# Patient Record
Sex: Male | Born: 1975 | Race: Black or African American | Hispanic: No | Marital: Married | Smoking: Former smoker
Health system: Southern US, Community
[De-identification: ages and names within clinical notes are randomized; demographics above are authoritative.]

## PROBLEM LIST (undated history)

## (undated) DIAGNOSIS — N471 Phimosis: Secondary | ICD-10-CM

---

## 2013-02-27 ENCOUNTER — Other Ambulatory Visit: Payer: Self-pay | Admitting: Urology

## 2013-03-01 ENCOUNTER — Encounter (HOSPITAL_BASED_OUTPATIENT_CLINIC_OR_DEPARTMENT_OTHER): Payer: Self-pay | Admitting: *Deleted
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
## 2013-03-02 ENCOUNTER — Encounter (HOSPITAL_BASED_OUTPATIENT_CLINIC_OR_DEPARTMENT_OTHER): Payer: Self-pay | Admitting: Anesthesiology

## 2013-03-02 ENCOUNTER — Ambulatory Visit (HOSPITAL_BASED_OUTPATIENT_CLINIC_OR_DEPARTMENT_OTHER)
Admission: RE | Admit: 2013-03-02 | Discharge: 2013-03-02 | Disposition: A | Discharge status: Home / Self Care | Payer: BC Managed Care – PPO | Source: Ambulatory Visit | Attending: Urology | Admitting: Urology

## 2013-03-02 ENCOUNTER — Ambulatory Visit (HOSPITAL_BASED_OUTPATIENT_CLINIC_OR_DEPARTMENT_OTHER): Payer: BC Managed Care – PPO | Admitting: Anesthesiology

## 2013-03-02 ENCOUNTER — Encounter (HOSPITAL_BASED_OUTPATIENT_CLINIC_OR_DEPARTMENT_OTHER)
Admission: RE | Disposition: A | Discharge status: Home / Self Care | Payer: Self-pay | Source: Ambulatory Visit | Attending: Urology

## 2013-03-02 DIAGNOSIS — E7800 Pure hypercholesterolemia, unspecified: Secondary | ICD-10-CM | POA: Insufficient documentation

## 2013-03-02 DIAGNOSIS — N471 Phimosis: Secondary | ICD-10-CM | POA: Insufficient documentation

## 2013-03-02 DIAGNOSIS — N476 Balanoposthitis: Secondary | ICD-10-CM | POA: Insufficient documentation

## 2013-03-02 DIAGNOSIS — N469 Male infertility, unspecified: Secondary | ICD-10-CM | POA: Insufficient documentation

## 2013-03-02 DIAGNOSIS — N478 Other disorders of prepuce: Secondary | ICD-10-CM | POA: Insufficient documentation

## 2013-03-02 DIAGNOSIS — Z87891 Personal history of nicotine dependence: Secondary | ICD-10-CM | POA: Insufficient documentation

## 2013-03-02 HISTORY — PX: CIRCUMCISION: SHX1350

## 2013-03-02 HISTORY — DX: Phimosis: N47.1

## 2013-03-02 SURGERY — CIRCUMCISION, ADULT
Anesthesia: General | Site: Penis | Wound class: Clean

## 2013-03-02 MED ORDER — LIDOCAINE HCL (CARDIAC) 20 MG/ML IV SOLN
INTRAVENOUS | Status: DC | PRN
  Administered 2013-03-02: 80 mg via INTRAVENOUS

## 2013-03-02 MED ORDER — LACTATED RINGERS IV SOLN
INTRAVENOUS | Status: DC
  Filled 2013-03-02: qty 1000

## 2013-03-02 MED ORDER — ONDANSETRON HCL 4 MG/2ML IJ SOLN
INTRAMUSCULAR | Status: DC | PRN
  Administered 2013-03-02: 4 mg via INTRAVENOUS

## 2013-03-02 MED ORDER — HYDROCODONE-ACETAMINOPHEN 5-325 MG PO TABS
1.0000 | ORAL_TABLET | ORAL | Status: DC | PRN
  Administered 2013-03-02: 1 via ORAL
  Filled 2013-03-02: qty 1

## 2013-03-02 MED ORDER — DEXAMETHASONE SODIUM PHOSPHATE 4 MG/ML IJ SOLN
INTRAMUSCULAR | Status: DC | PRN
  Administered 2013-03-02: 10 mg via INTRAVENOUS

## 2013-03-02 MED ORDER — MIDAZOLAM HCL 5 MG/5ML IJ SOLN
INTRAMUSCULAR | Status: DC | PRN
  Administered 2013-03-02: 2 mg via INTRAVENOUS

## 2013-03-02 MED ORDER — LACTATED RINGERS IV SOLN
INTRAVENOUS | Status: DC
  Administered 2013-03-02: 08:00:00 via INTRAVENOUS
  Filled 2013-03-02: qty 1000

## 2013-03-02 MED ORDER — FENTANYL CITRATE 0.05 MG/ML IJ SOLN
25.0000 ug | INTRAMUSCULAR | Status: DC | PRN
  Filled 2013-03-02: qty 1

## 2013-03-02 MED ORDER — BUPIVACAINE HCL (PF) 0.25 % IJ SOLN
INTRAMUSCULAR | Status: DC | PRN
  Administered 2013-03-02: 10 mL

## 2013-03-02 MED ORDER — FENTANYL CITRATE 0.05 MG/ML IJ SOLN
INTRAMUSCULAR | Status: DC | PRN
  Administered 2013-03-02 (×2): 50 ug via INTRAVENOUS

## 2013-03-02 MED ORDER — BACITRACIN-NEOMYCIN-POLYMYXIN 400-5-5000 EX OINT
TOPICAL_OINTMENT | CUTANEOUS | Status: DC | PRN
  Administered 2013-03-02: 1 via TOPICAL

## 2013-03-02 MED ORDER — HYDROCODONE-ACETAMINOPHEN 5-325 MG PO TABS: 1.0000 | ORAL_TABLET | Freq: Four times a day (QID) | ORAL | Status: DC | PRN

## 2013-03-02 MED ORDER — PROPOFOL 10 MG/ML IV BOLUS
INTRAVENOUS | Status: DC | PRN
  Administered 2013-03-02: 250 mg via INTRAVENOUS

## 2013-03-02 SURGICAL SUPPLY — 25 items
BANDAGE CO FLEX L/F 1IN X 5YD (GAUZE/BANDAGES/DRESSINGS) IMPLANT
BANDAGE CO FLEX L/F 2IN X 5YD (GAUZE/BANDAGES/DRESSINGS) ×2 IMPLANT
BANDAGE CONFORM 2  STR LF (GAUZE/BANDAGES/DRESSINGS) ×4 IMPLANT
BLADE SURG 15 STRL LF DISP TIS (BLADE) ×1 IMPLANT
BLADE SURG 15 STRL SS (BLADE) ×1
CLOTH BEACON ORANGE TIMEOUT ST (SAFETY) ×2 IMPLANT
COVER MAYO STAND STRL (DRAPES) ×2 IMPLANT
COVER TABLE BACK 60X90 (DRAPES) ×2 IMPLANT
DRAPE PED LAPAROTOMY (DRAPES) ×2 IMPLANT
ELECT REM PT RETURN 9FT ADLT (ELECTROSURGICAL) ×2
ELECTRODE REM PT RTRN 9FT ADLT (ELECTROSURGICAL) ×1 IMPLANT
GAUZE VASELINE 1X8 (GAUZE/BANDAGES/DRESSINGS) ×4 IMPLANT
GLOVE BIO SURGEON STRL SZ7 (GLOVE) ×4 IMPLANT
GLOVE INDICATOR 7.0 STRL GRN (GLOVE) ×2 IMPLANT
GOWN PREVENTION PLUS LG XLONG (DISPOSABLE) ×4 IMPLANT
GOWN STRL REIN XL XLG (GOWN DISPOSABLE) ×2 IMPLANT
NEEDLE HYPO 25X1 1.5 SAFETY (NEEDLE) ×2 IMPLANT
PACK BASIN DAY SURGERY FS (CUSTOM PROCEDURE TRAY) ×2 IMPLANT
PENCIL BUTTON HOLSTER BLD 10FT (ELECTRODE) ×2 IMPLANT
SUT CHROMIC 4 0 P 3 18 (SUTURE) ×4 IMPLANT
SUT CHROMIC 5 0 RB 1 27 (SUTURE) IMPLANT
SYR CONTROL 10ML LL (SYRINGE) ×2 IMPLANT
TOWEL OR 17X24 6PK STRL BLUE (TOWEL DISPOSABLE) ×4 IMPLANT
TRAY DSU PREP LF (CUSTOM PROCEDURE TRAY) ×2 IMPLANT
WATER STERILE IRR 500ML POUR (IV SOLUTION) ×2 IMPLANT

## 2013-03-02 NOTE — Anesthesia Procedure Notes (Signed)
Procedure Name: LMA Insertion Date/Time: 03/02/2013 9:26 AM Performed by: Maris Berger T Pre-anesthesia Checklist: Patient identified, Emergency Drugs available, Suction available and Patient being monitored Patient Re-evaluated:Patient Re-evaluated prior to inductionOxygen Delivery Method: Circle System Utilized Preoxygenation: Pre-oxygenation with 100% oxygen Intubation Type: IV induction Ventilation: Mask ventilation without difficulty LMA: LMA inserted LMA Size: 5.0 Number of attempts: 1 Airway Equipment and Method: bite block Placement Confirmation: positive ETCO2 Dental Injury: Teeth and Oropharynx as per pre-operative assessment

## 2013-03-02 NOTE — Op Note (Signed)
Sean Fitzgerald is a 37 y.o.   03/02/2013  General  Pre-op diagnosis: Phimosis, balanitis  Postop diagnosis: Same  Procedure done: Circumcision  Surgeon: Wendie Simmer. Viana Sleep  Anesthesia: General  Indication: Patient is a 37 years old male who has been complaining of irritation of the foreskin. He also would like to be circumcised for religious reasons. He is scheduled today for circumcision.  Procedure: Patient was identified by his wrist band and proper timeout was taken.  Under general anesthesia he was prepped and draped and placed in the supine position. A penile block was done with 0.5% Marcaine. Then 2 circumferential incisions were made on the foreskin and the foreskin in between those 2 incisions was excised. A frenulotomy was done. Hemostasis was secured with electrocautery. Skin approximation was then done with #4-0 chromic. Sterile eye dressing was then applied to the wound.  EBL: Minimal  Needles, sponges count: Correct.  The patient tolerated the procedure well and left the or in satisfactory condition to postanesthesia care unit.

## 2013-03-02 NOTE — H&P (Signed)
History of Present Illness        Sean Fitzgerald is referred by Dr Knox Royalty of [facility] for consultation regarding circumcision.  He would like to have a circumcision for religious reasons.  He also has been having irritation of the foreskin for several months.  The foreskin is reddened.  He has difficulty retracting it.  He has been married a year and does not have any children.  He would like to know if he can father a child.   Past Medical History Problems  1. Former Smoker V15.82 2. History of  Hypercholesterolemia 272.0  Surgical History Problems  1. History of  No Surgical Problems  Current Meds 1. No Medications; Therapy: (Recorded:28Apr2014) to  Allergies Medication  1. No Known Drug Allergies  Family History  Negative for diabetes, hypertension.   Social History Problems    Caffeine Use   Marital History - Currently Married Denied    History of  Alcohol Use  Review of Systems Genitourinary, constitutional, skin, eye, otolaryngeal, hematologic/lymphatic, cardiovascular, pulmonary, endocrine, musculoskeletal, gastrointestinal, neurological and psychiatric system(s) were reviewed and pertinent findings if present are noted.  Genitourinary: penile erythema.    Vitals Vital Signs [Data Includes: Last 1 Day]  28Apr2014 03:14PM  BMI Calculated: 30.66 BSA Calculated: 1.91 Height: 5 ft 5 in Weight: 184 lb  Blood Pressure: 117 / 75 Temperature: 97.7 F Heart Rate: 66  Physical Exam Constitutional: Well nourished and well developed . No acute distress.  ENT:. The ears and nose are normal in appearance.  Neck: The appearance of the neck is normal and no neck mass is present.  Pulmonary: No respiratory distress and normal respiratory rhythm and effort.  Cardiovascular: Heart rate and rhythm are normal . No peripheral edema.  Abdomen: The abdomen is soft and nontender. No masses are palpated. No CVA tenderness. No hernias are palpable. No  hepatosplenomegaly noted.  Genitourinary: Examination of the penis demonstrates no discharge, no masses, no lesions and a normal meatus. The scrotum is without lesions. The right epididymis is palpably normal and non-tender. The left epididymis is palpably normal and non-tender. The right testis is non-tender and without masses. The left testis is non-tender and without masses.  Lymphatics: The femoral and inguinal nodes are not enlarged or tender.  Skin: Normal skin turgor, no visible rash and no visible skin lesions.  Neuro/Psych:. Mood and affect are appropriate.    Results/Data  26 Feb 2013 3:07 PM   UA With REFLEX       COLOR YELLOW       APPEARANCE CLEAR       SPECIFIC GRAVITY 1.020       pH 6.0       GLUCOSE NEG       BILIRUBIN NEG       KETONE NEG       BLOOD NEG       PROTEIN 30       UROBILINOGEN 0.2       NITRITE NEG       LEUKOCYTE ESTERASE NEG       SQUAMOUS EPITHELIAL/HPF NONE SEEN       WBC 0-2       CRYSTALS NONE SEEN       CASTS NONE SEEN       RBC 0-2       BACTERIA NONE SEEN      Assessment Assessed  1. Phimosis 605 2. 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 Balanitis 607.1  Plan  Male Infertility (606.9)  1. SEMEN ANALYSIS COMPREHENSIVE  Requested for: 28Apr2014 2. Follow-up Schedule Surgery Office  Follow-up  Done: 28Apr2014   Circumcision.  The procedure, risks, benefits were explained to the patient.  The risks include but are not limited to hemorrhage, hematoma, infection, skin loss.  He understands and wishes to proceed.  Sperm analysis for infertility evaluation.    UA With REFLEX  Status: Resulted - Requires Verification  Done: 01Jan0001 12:00AM Ordered Today; For: Health Maintenance (V70.0); Ordered By: Su Grand  Due: 30Apr2014 Marked Important; Last Updated By: Thomasenia Sales   Signatures  CC: Dr Knox Royalty  Electronically signed by : Su Grand, M.D.; Feb 26 2013  5:45PM

## 2013-03-02 NOTE — Anesthesia Preprocedure Evaluation (Addendum)

## 2013-03-02 NOTE — Anesthesia Postprocedure Evaluation (Signed)
  Anesthesia Post-op Note  Patient: Sean Fitzgerald  Procedure(s) Performed: Procedure(s) (LRB): CIRCUMCISION ADULT (N/A)  Patient Location: PACU  Anesthesia Type: General  Level of Consciousness: awake and alert   Airway and Oxygen Therapy: Patient Spontanous Breathing  Post-op Pain: mild  Post-op Assessment: Post-op Vital signs reviewed, Patient's Cardiovascular Status Stable, Respiratory Function Stable, Patent Airway and No signs of Nausea or vomiting  Last Vitals:  Filed Vitals:   03/02/13 1027  BP: 125/82  Pulse: 57  Temp: 36.9 C  Resp: 12    Post-op Vital Signs: stable   Complications: No apparent anesthesia complications

## 2013-03-02 NOTE — Transfer of Care (Signed)
Immediate Anesthesia Transfer of Care Note  Patient: Sean Fitzgerald  Procedure(s) Performed: Procedure(s) with comments: CIRCUMCISION ADULT (N/A) - 45 MIN   Patient Location: PACU  Anesthesia Type:General  Level of Consciousness: sedated and responds to stimulation  Airway & Oxygen Therapy: Patient Spontanous Breathing and Patient connected to nasal cannula oxygen  Post-op Assessment: Report given to PACU RN  Post vital signs: Reviewed and stable  Complications: No apparent anesthesia complications

## 2013-03-05 ENCOUNTER — Encounter (HOSPITAL_BASED_OUTPATIENT_CLINIC_OR_DEPARTMENT_OTHER): Payer: Self-pay | Admitting: Urology

## 2013-03-08 LAB — POCT HEMOGLOBIN-HEMACUE: Hemoglobin: 15.2 g/dL (ref 13.0–17.0)

## 2014-04-03 ENCOUNTER — Ambulatory Visit: Payer: BC Managed Care – PPO | Admitting: Internal Medicine

## 2014-04-03 DIAGNOSIS — Z0289 Encounter for other administrative examinations: Secondary | ICD-10-CM

## 2016-11-17 ENCOUNTER — Emergency Department
Admission: EM | Admit: 2016-11-17 | Discharge: 2016-11-17 | Disposition: A | Discharge status: Home / Self Care | Payer: Worker's Compensation | Attending: Emergency Medicine | Admitting: Emergency Medicine

## 2016-11-17 ENCOUNTER — Emergency Department: Payer: Worker's Compensation

## 2016-11-17 ENCOUNTER — Encounter: Payer: Self-pay | Admitting: Emergency Medicine

## 2016-11-17 DIAGNOSIS — S39012A Strain of muscle, fascia and tendon of lower back, initial encounter: Secondary | ICD-10-CM | POA: Diagnosis not present

## 2016-11-17 DIAGNOSIS — S199XXA Unspecified injury of neck, initial encounter: Secondary | ICD-10-CM | POA: Diagnosis present

## 2016-11-17 DIAGNOSIS — Y92411 Interstate highway as the place of occurrence of the external cause: Secondary | ICD-10-CM | POA: Insufficient documentation

## 2016-11-17 DIAGNOSIS — M62830 Muscle spasm of back: Secondary | ICD-10-CM

## 2016-11-17 DIAGNOSIS — Y999 Unspecified external cause status: Secondary | ICD-10-CM | POA: Insufficient documentation

## 2016-11-17 DIAGNOSIS — S161XXA Strain of muscle, fascia and tendon at neck level, initial encounter: Secondary | ICD-10-CM | POA: Diagnosis not present

## 2016-11-17 DIAGNOSIS — S0081XA Abrasion of other part of head, initial encounter: Secondary | ICD-10-CM | POA: Diagnosis not present

## 2016-11-17 DIAGNOSIS — S0990XA Unspecified injury of head, initial encounter: Secondary | ICD-10-CM | POA: Insufficient documentation

## 2016-11-17 DIAGNOSIS — Z79899 Other long term (current) drug therapy: Secondary | ICD-10-CM | POA: Insufficient documentation

## 2016-11-17 DIAGNOSIS — Y9389 Activity, other specified: Secondary | ICD-10-CM | POA: Insufficient documentation

## 2016-11-17 LAB — CBC WITH DIFFERENTIAL/PLATELET
BASOS PCT: 1 %
Basophils Absolute: 0 10*3/uL (ref 0–0.1)
EOS ABS: 0 10*3/uL (ref 0–0.7)
EOS PCT: 1 %
HCT: 41.9 % (ref 40.0–52.0)
Hemoglobin: 14.4 g/dL (ref 13.0–18.0)
LYMPHS ABS: 1.7 10*3/uL (ref 1.0–3.6)
Lymphocytes Relative: 32 %
MCH: 29.8 pg (ref 26.0–34.0)
MCHC: 34.3 g/dL (ref 32.0–36.0)
MCV: 86.9 fL (ref 80.0–100.0)
MONO ABS: 0.4 10*3/uL (ref 0.2–1.0)
MONOS PCT: 8 %
NEUTROS PCT: 58 %
Neutro Abs: 3 10*3/uL (ref 1.4–6.5)
Platelets: 222 10*3/uL (ref 150–440)
RBC: 4.82 MIL/uL (ref 4.40–5.90)
RDW: 14.2 % (ref 11.5–14.5)
WBC: 5.2 10*3/uL (ref 3.8–10.6)

## 2016-11-17 LAB — BASIC METABOLIC PANEL
Anion gap: 5 (ref 5–15)
BUN: 14 mg/dL (ref 6–20)
CALCIUM: 9.2 mg/dL (ref 8.9–10.3)
CHLORIDE: 107 mmol/L (ref 101–111)
CO2: 28 mmol/L (ref 22–32)
CREATININE: 1.66 mg/dL — AB (ref 0.61–1.24)
GFR calc non Af Amer: 50 mL/min — ABNORMAL LOW (ref >60)
GFR, EST AFRICAN AMERICAN: 59 mL/min — AB (ref >60)
GLUCOSE: 99 mg/dL (ref 65–99)
Potassium: 4.2 mmol/L (ref 3.5–5.1)
Sodium: 140 mmol/L (ref 135–145)

## 2016-11-17 MED ORDER — KETOROLAC TROMETHAMINE 30 MG/ML IJ SOLN
30.0000 mg | Freq: Once | INTRAMUSCULAR | Status: AC
  Administered 2016-11-17: 30 mg via INTRAVENOUS
  Filled 2016-11-17: qty 1

## 2016-11-17 MED ORDER — CYCLOBENZAPRINE HCL 10 MG PO TABS: 10.0000 mg | ORAL_TABLET | Freq: Three times a day (TID) | ORAL | 0 refills | Status: DC | PRN

## 2016-11-17 MED ORDER — FENTANYL CITRATE (PF) 100 MCG/2ML IJ SOLN
50.0000 ug | Freq: Once | INTRAMUSCULAR | Status: AC
  Administered 2016-11-17: 50 ug via INTRAVENOUS
  Filled 2016-11-17: qty 2

## 2016-11-17 MED ORDER — NAPROXEN 500 MG PO TABS: 500.0000 mg | ORAL_TABLET | Freq: Two times a day (BID) | ORAL | 0 refills | Status: DC

## 2016-11-17 MED ORDER — ORPHENADRINE CITRATE 30 MG/ML IJ SOLN
60.0000 mg | Freq: Two times a day (BID) | INTRAMUSCULAR | Status: DC
  Administered 2016-11-17: 60 mg via INTRAVENOUS
  Filled 2016-11-17: qty 2

## 2016-11-17 NOTE — ED Notes (Signed)
DOT Urine and DOT ETOH testing preformed.
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
## 2016-11-17 NOTE — ED Notes (Signed)
Wound around right eye irrigated and dressing applied.

## 2016-11-17 NOTE — ED Notes (Signed)
Pt verbalized understanding of discharge instructions. NAD at this time. 

## 2017-07-10 ENCOUNTER — Encounter (HOSPITAL_COMMUNITY): Payer: Self-pay | Admitting: *Deleted

## 2017-07-10 ENCOUNTER — Ambulatory Visit (HOSPITAL_COMMUNITY)
Admission: EM | Admit: 2017-07-10 | Discharge: 2017-07-10 | Disposition: A | Discharge status: Home / Self Care | Payer: Self-pay | Attending: Urgent Care | Admitting: Urgent Care

## 2017-07-10 DIAGNOSIS — N471 Phimosis: Secondary | ICD-10-CM | POA: Insufficient documentation

## 2017-07-10 DIAGNOSIS — Z202 Contact with and (suspected) exposure to infections with a predominantly sexual mode of transmission: Secondary | ICD-10-CM | POA: Insufficient documentation

## 2017-07-10 DIAGNOSIS — Z711 Person with feared health complaint in whom no diagnosis is made: Secondary | ICD-10-CM

## 2017-07-10 DIAGNOSIS — Z113 Encounter for screening for infections with a predominantly sexual mode of transmission: Secondary | ICD-10-CM

## 2017-07-10 NOTE — ED Provider Notes (Signed)
   MRN: [id number] DOB: 07-07-1976  Subjective:   Sean Fitzgerald is a 41 y.o. male presenting for chief complaint of Exposure to STD  Reports patient had exposure to BV from 2 of his wives. She is currently undergoing treatment for this. Denies dysuria, hematuria, urinary frequency, penile discharge, penile swelling, testicular pain, testicular swelling, anal pain, groin pain. They were concerned that Sean Fitzgerald may need an STI screen.  Sean Fitzgerald is not currently taking any medications. Also has No Known Allergies.  Sean Fitzgerald  has a past medical history of Phimosis. Also  has a past surgical history that includes Circumcision (N/A, 03/02/2013).  Objective:   Vitals: BP 117/74   Pulse (!) 46   Temp 98.3 F (36.8 C)   Resp 16   SpO2 100%   Physical Exam  Constitutional: He is oriented to person, place, and time. He appears well-developed and well-nourished.  Cardiovascular: Normal rate.   Pulmonary/Chest: Effort normal.  Neurological: He is alert and oriented to person, place, and time.   Assessment and Plan :   Possible exposure to STD  Concern about STD in male without diagnosis  Labs pending, treatment is not recommended for male sexual partners of women diagnosed with BV. I reviewed these recommendations with patient and his wife. They verbalized understanding. Will treat as appropriate  Sean BambergMario Corene Resnick, PA-C [facility] [phone] 07/10/2017  7:30 PM    Sean BambergMani, Sean Tinkey, PA-C 07/10/17 1958

## 2017-07-10 NOTE — ED Triage Notes (Signed)
Reports exposure to 2 partners both of whom are being treated for BV.  Denies any sxs.

## 2017-07-10 NOTE — ED Notes (Signed)
Call back number verified and updated in EPIC... Adv pt to not have SI until lab results comeback neg.... Also adv pt lab results will be on MyChart; instructions given .... Pt verb understanding.   

## 2017-07-11 LAB — URINE CYTOLOGY ANCILLARY ONLY
Chlamydia: NEGATIVE
Neisseria Gonorrhea: NEGATIVE
Trichomonas: NEGATIVE

## 2017-07-11 LAB — HIV ANTIBODY (ROUTINE TESTING W REFLEX): HIV Screen 4th Generation wRfx: NONREACTIVE

## 2017-07-11 LAB — RPR: RPR Ser Ql: NONREACTIVE

## 2018-01-02 IMAGING — CR DG CHEST 2V
1 series · 2 of 2 positions shown · non-contrast
Comparison: None.

CLINICAL DATA: Head injury.  [place].

EXAM:
CHEST  2 VIEW

[Series 1: dg chest 2 view · 0.14mm/px · 2 of 2 slices shown]
[im 1/2]
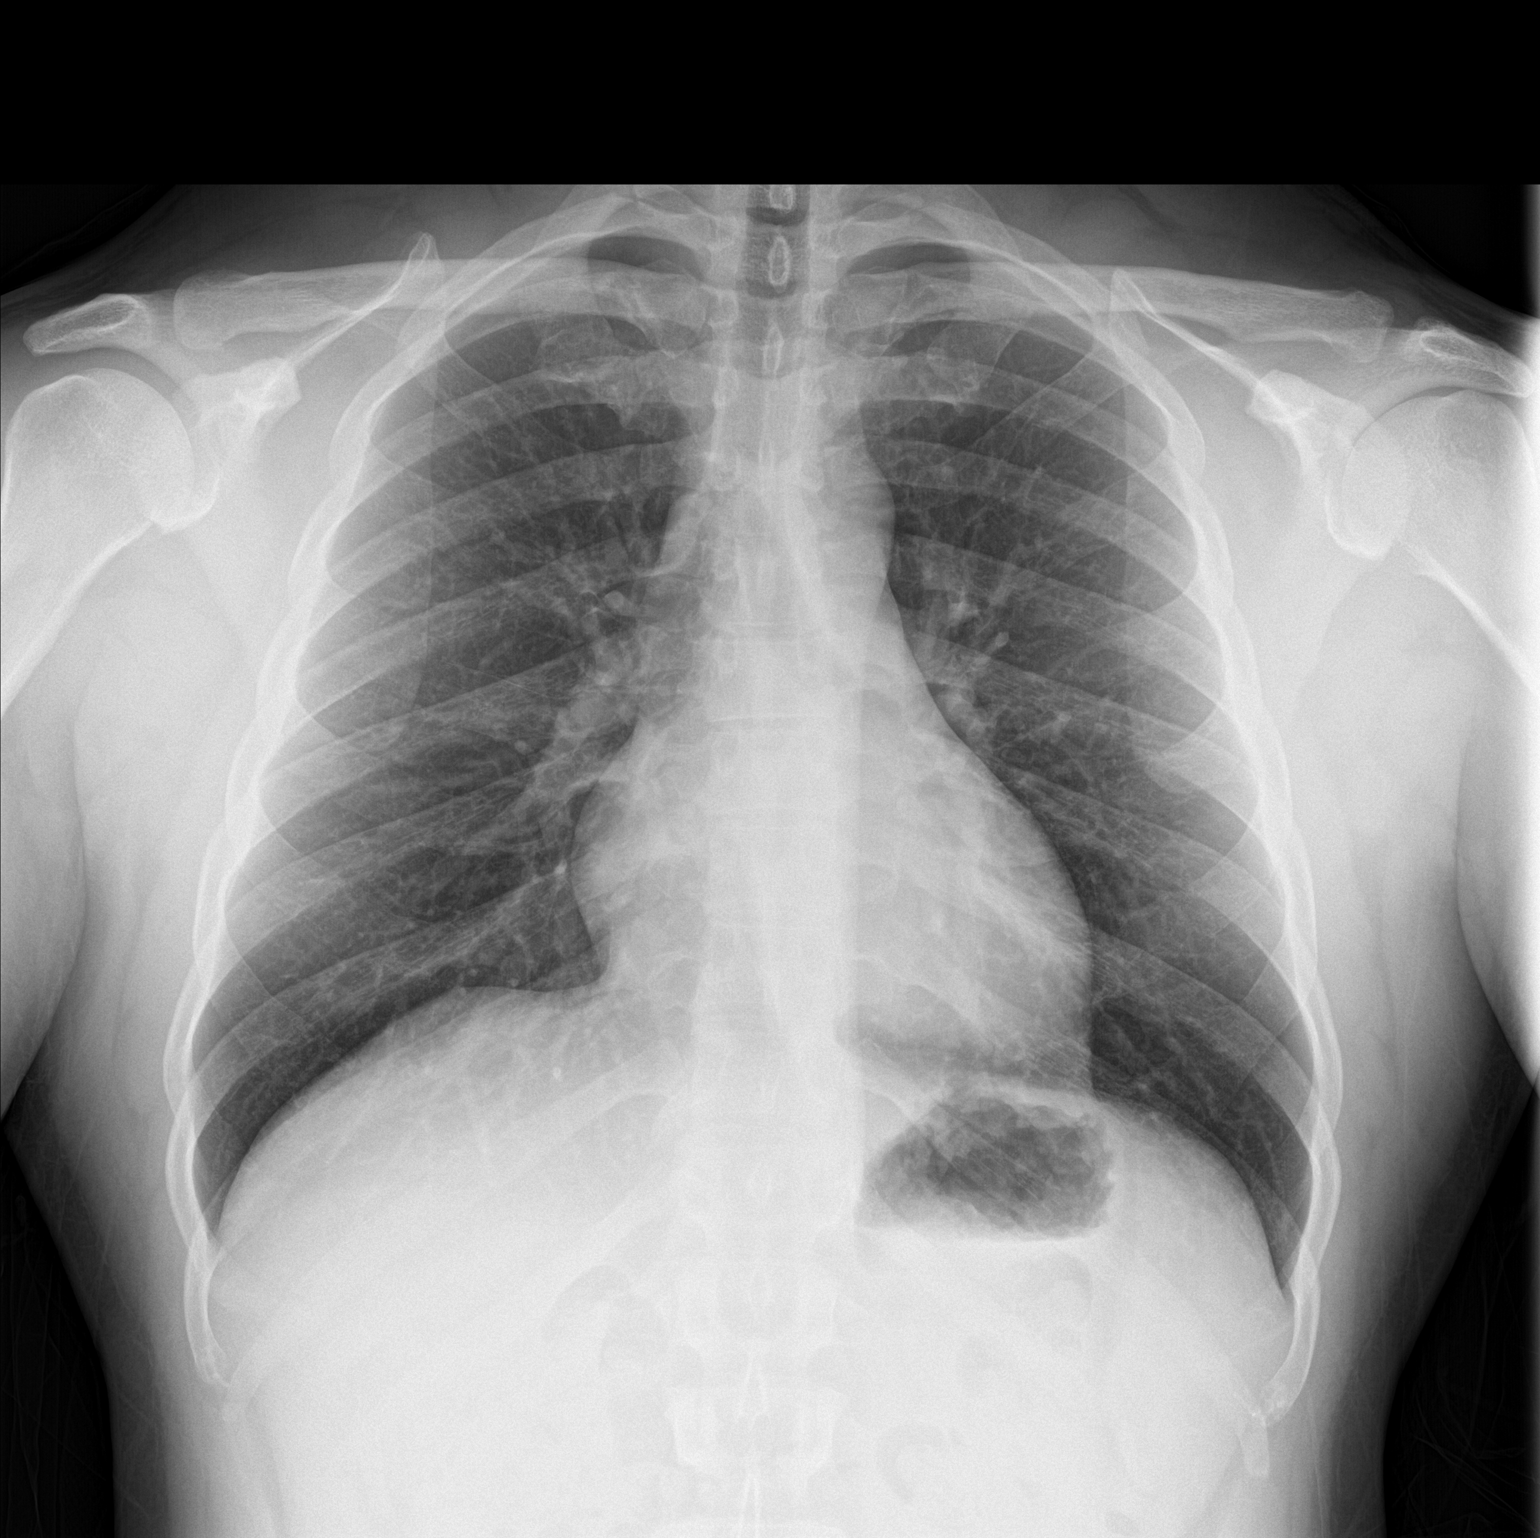
[im 2/2]
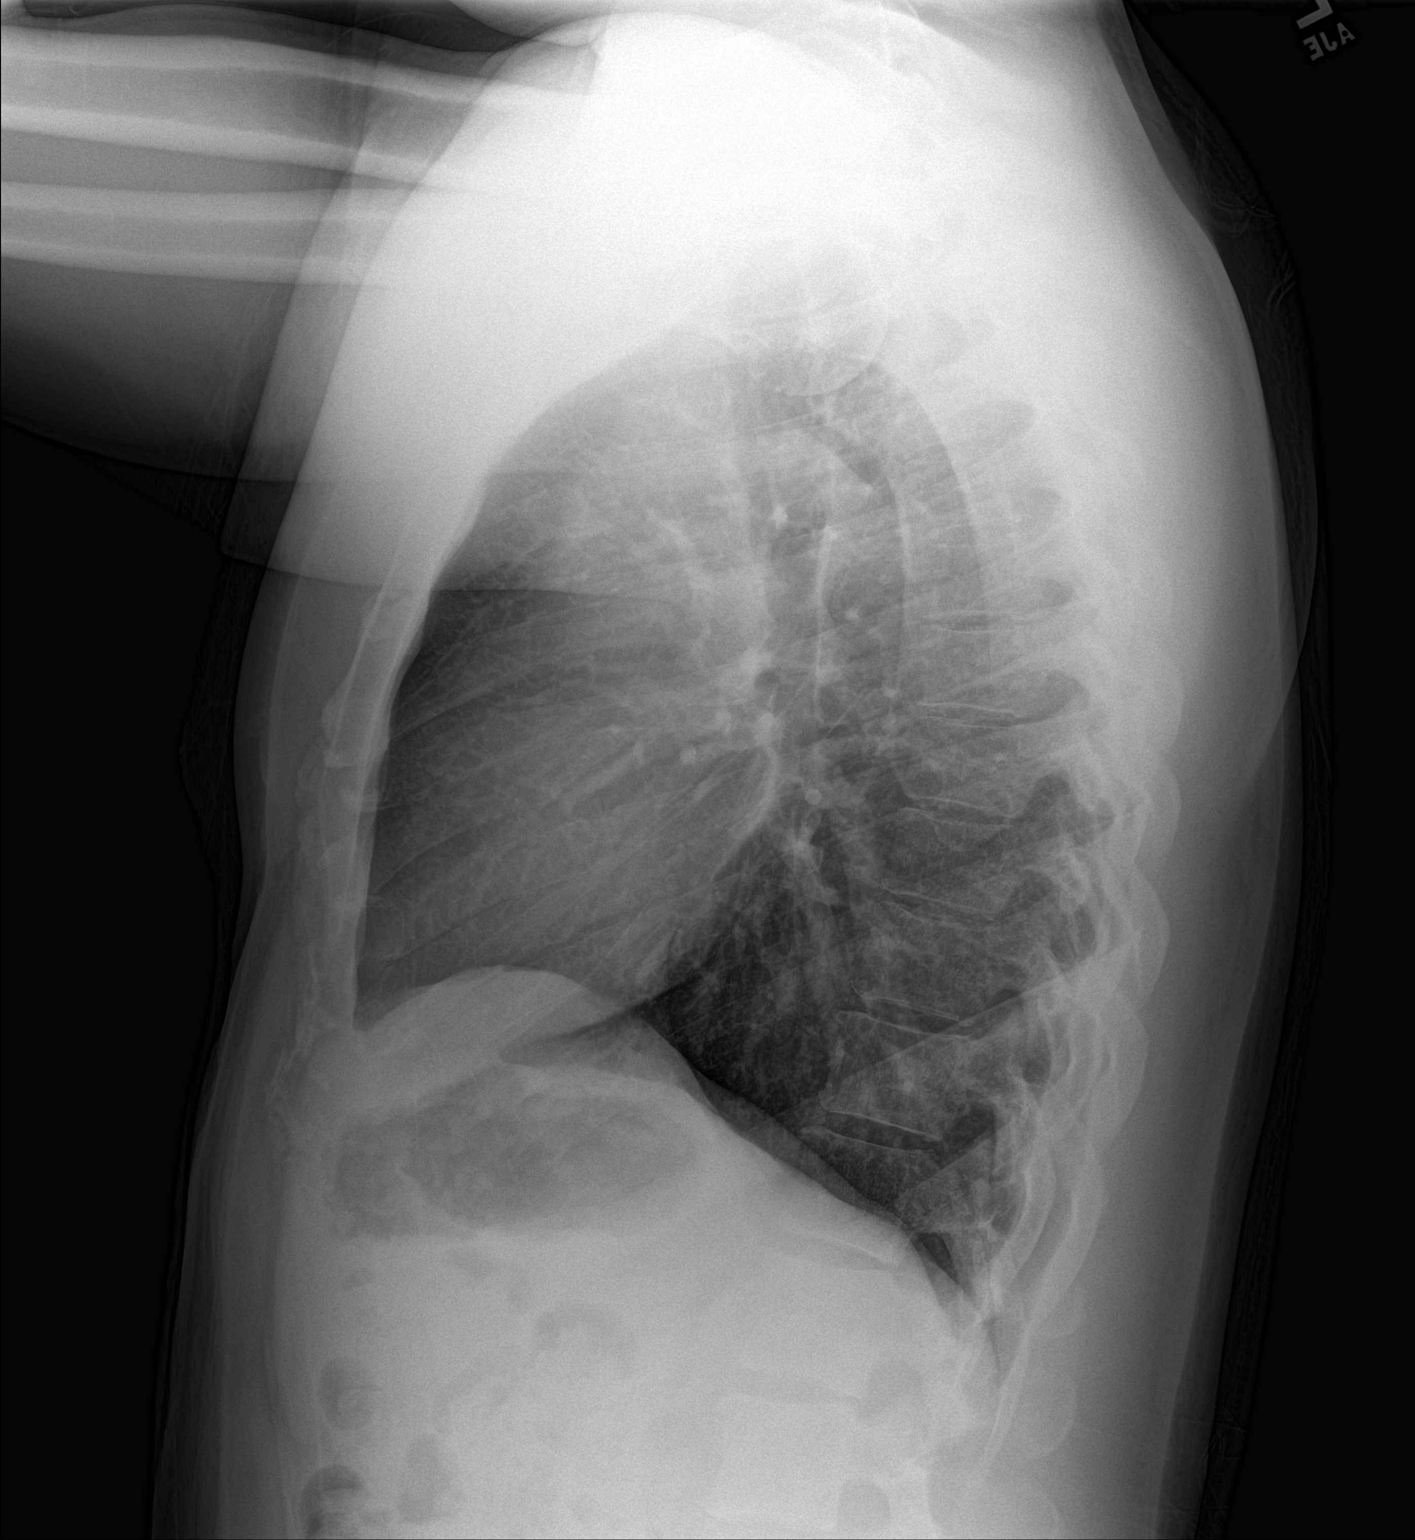

[2 of 2 positions shown; findings below may reference images not displayed]

FINDINGS: The heart size and mediastinal contours are within normal limits.
Both lungs are clear. The visualized skeletal structures are
unremarkable.
IMPRESSION: No active cardiopulmonary disease.

## 2018-08-29 ENCOUNTER — Ambulatory Visit (INDEPENDENT_AMBULATORY_CARE_PROVIDER_SITE_OTHER): Payer: Commercial Managed Care - PPO | Admitting: Family Medicine

## 2018-08-29 ENCOUNTER — Encounter: Payer: Self-pay | Admitting: Family Medicine

## 2018-08-29 VITALS — BP 120/77 | HR 77 | Temp 97.7°F | Ht 65.0 in | Wt 191.0 lb

## 2018-08-29 DIAGNOSIS — Z0000 Encounter for general adult medical examination without abnormal findings: Secondary | ICD-10-CM | POA: Diagnosis not present

## 2018-08-29 DIAGNOSIS — Z9889 Other specified postprocedural states: Secondary | ICD-10-CM | POA: Insufficient documentation

## 2018-08-29 DIAGNOSIS — Z6831 Body mass index (BMI) 31.0-31.9, adult: Secondary | ICD-10-CM | POA: Diagnosis not present

## 2018-08-29 NOTE — Progress Notes (Signed)
Subjective:    Patient ID: Sean Fitzgerald, male    DOB: 1975-11-18, 42 y.o.   MRN: [id number]  Chief Complaint:  Annual Exam (for insurance purposes, patient is not fasting)   HPI: Sean Fitzgerald is a 42 y.o. male presenting on 08/29/2018 for Annual Exam (for insurance purposes, patient is not fasting)  Pt presents today for his annual physical exam. He denies complaints or concerns. States he is doing great overall. He states he does not take any medications on a regular basis. States he rarely needs over the counter tylenol or motrin. Denies alcohol, tobacco, or drug use. States the only significant family history is breast cancer. Denies a family history of colorectal cancer or prostate cancer.   Relevant past medical, surgical, family, and social history reviewed and updated as indicated.  Allergies and medications reviewed and updated.   Past Medical History:  Diagnosis Date  . Phimosis     Past Surgical History:  Procedure Laterality Date  . CIRCUMCISION N/A 03/02/2013   Procedure: CIRCUMCISION ADULT;  Surgeon: Lindaann Slough, MD;  Location: [facility];  Service: Urology;  Laterality: N/A;  45 MIN     Social History   Socioeconomic History  . Marital status: Married    Spouse name: Not on file  . Number of children: Not on file  . Years of education: Not on file  . Highest education level: Not on file  Occupational History  . Not on file  Social Needs  . Financial resource strain: Not on file  . Food insecurity:    Worry: Not on file    Inability: Not on file  . Transportation needs:    Medical: Not on file    Non-medical: Not on file  Tobacco Use  . Smoking status: Former Smoker    Packs/day: 2.00    Years: 8.00    Pack years: 16.00    Types: Cigarettes    Last attempt to quit: 11/01/1997    Years since quitting: 20.8  . Smokeless tobacco: Never Used  Substance and Sexual Activity  . Alcohol use: No  . Drug use: No  . Sexual  activity: Not on file  Lifestyle  . Physical activity:    Days per week: Not on file    Minutes per session: Not on file  . Stress: Not on file  Relationships  . Social connections:    Talks on phone: Not on file    Gets together: Not on file    Attends religious service: Not on file    Active member of club or organization: Not on file    Attends meetings of clubs or organizations: Not on file    Relationship status: Not on file  . Intimate partner violence:    Fear of current or ex partner: Not on file    Emotionally abused: Not on file    Physically abused: Not on file    Forced sexual activity: Not on file  Other Topics Concern  . Not on file  Social History Narrative  . Not on file    Outpatient Encounter Medications as of 08/29/2018  Medication Sig  . [DISCONTINUED] cyclobenzaprine (FLEXERIL) 10 MG tablet Take 1 tablet (10 mg total) by mouth 3 (three) times daily as needed for muscle spasms.  . [DISCONTINUED] HYDROcodone-acetaminophen (NORCO) 5-325 MG per tablet Take 1 tablet by mouth every 6 (six) hours as needed for pain.  . [DISCONTINUED] loratadine (ALLERGY RELIEF) 10 MG tablet Take 10  mg by mouth as needed for allergies.  . [DISCONTINUED] naproxen (NAPROSYN) 500 MG tablet Take 1 tablet (500 mg total) by mouth 2 (two) times daily with a meal.   No facility-administered encounter medications on file as of 08/29/2018.     No Known Allergies  Review of Systems  Constitutional: Negative for activity change, appetite change, chills, fatigue and fever.  HENT: Negative.   Eyes: Negative.   Respiratory: Negative for cough, chest tightness and shortness of breath.   Cardiovascular: Negative for chest pain, palpitations and leg swelling.  Gastrointestinal: Negative for abdominal pain, anal bleeding, blood in stool, constipation, diarrhea, nausea, rectal pain and vomiting.  Endocrine: Negative for cold intolerance, heat intolerance, polydipsia and polyphagia.    Genitourinary: Negative.   Musculoskeletal: Negative for arthralgias, back pain and myalgias.  Skin: Negative for color change, rash and wound.  Allergic/Immunologic: Negative.   Neurological: Negative for dizziness, tremors, seizures, speech difficulty, weakness, light-headedness and headaches.  Psychiatric/Behavioral: Negative.   All other systems reviewed and are negative.       Objective:    BP 120/77   Pulse 77   Temp 97.7 F (36.5 C) (Oral)   Ht 5\' 5"  (1.651 m)   Wt 191 lb (86.6 kg)   BMI 31.78 kg/m    Wt Readings from Last 3 Encounters:  08/29/18 191 lb (86.6 kg)  11/17/16 170 lb (77.1 kg)  03/02/13 183 lb (83 kg)    Physical Exam  Constitutional: He is oriented to person, place, and time. He appears well-developed and well-nourished. He is cooperative. No distress.  HENT:  Head: Normocephalic and atraumatic.  Right Ear: Hearing, tympanic membrane, external ear and ear canal normal.  Left Ear: Hearing, tympanic membrane, external ear and ear canal normal.  Nose: Nose normal.  Mouth/Throat: Uvula is midline, oropharynx is clear and moist and mucous membranes are normal. No tonsillar exudate.  Eyes: Pupils are equal, round, and reactive to light. Conjunctivae, EOM and lids are normal.  Neck: Trachea normal, normal range of motion, full passive range of motion without pain and phonation normal. Neck supple. No JVD present. Carotid bruit is not present. No thyroid mass and no thyromegaly present.  Cardiovascular: Normal rate, regular rhythm, normal heart sounds and intact distal pulses. PMI is not displaced. Exam reveals no gallop and no friction rub.  No murmur heard. Pulmonary/Chest: Effort normal and breath sounds normal.  Abdominal: Soft. Normal appearance and normal aorta. There is no hepatosplenomegaly. There is no tenderness. No hernia.  Musculoskeletal: Normal range of motion.  Lymphadenopathy:    He has no cervical adenopathy.  Neurological: He is alert and  oriented to person, place, and time. He has normal strength and normal reflexes. No cranial nerve deficit or sensory deficit.  Skin: Skin is warm, dry and intact. Capillary refill takes less than 2 seconds.  Psychiatric: He has a normal mood and affect. His speech is normal and behavior is normal. Judgment and thought content normal. Cognition and memory are normal.  Nursing note and vitals reviewed.   Results for orders placed or performed during the hospital encounter of 07/10/17  RPR  Result Value Ref Range   RPR Ser Ql Non Reactive Non Reactive  HIV antibody  Result Value Ref Range   HIV Screen 4th Generation wRfx Non Reactive Non Reactive  Urine cytology ancillary only  Result Value Ref Range   Chlamydia Negative    Neisseria gonorrhea Negative    Trichomonas Negative  Pertinent labs & imaging results that were available during my care of the patient were reviewed by me and considered in my medical decision making.  Assessment & Plan:  Kyal was seen today for annual exam.  Diagnoses and all orders for this visit:  Routine general medical examination at a health care facility -     Comprehensive metabolic panel -     Lipid panel -     CBC with Differential -     TSH  BMI 31.0-31.9,adult Diet and exercise encouraged.    Continue all other maintenance medications.  Follow up plan: Return in 1 year (on 08/30/2019), or if symptoms worsen or fail to improve.  Educational handout given for health maintenance, male  The above assessment and management plan was discussed with the patient. The patient verbalized understanding of and has agreed to the management plan. Patient is aware to call the clinic if symptoms persist or worsen. Patient is aware when to return to the clinic for a follow-up visit. Patient educated on when it is appropriate to go to the emergency department.   Kari Baars, FNP-C [facility] [phone]

## 2018-08-29 NOTE — Patient Instructions (Signed)

## 2018-08-30 ENCOUNTER — Telehealth: Payer: Self-pay | Admitting: Family Medicine

## 2018-08-30 LAB — CBC WITH DIFFERENTIAL/PLATELET
BASOS: 1 %
Basophils Absolute: 0 10*3/uL (ref 0.0–0.2)
EOS (ABSOLUTE): 0.1 10*3/uL (ref 0.0–0.4)
EOS: 1 %
HEMATOCRIT: 44.1 % (ref 37.5–51.0)
HEMOGLOBIN: 14.4 g/dL (ref 13.0–17.7)
IMMATURE GRANS (ABS): 0 10*3/uL (ref 0.0–0.1)
Immature Granulocytes: 1 %
LYMPHS ABS: 2.1 10*3/uL (ref 0.7–3.1)
Lymphs: 38 %
MCH: 28.9 pg (ref 26.6–33.0)
MCHC: 32.7 g/dL (ref 31.5–35.7)
MCV: 89 fL (ref 79–97)
MONOCYTES: 9 %
Monocytes Absolute: 0.5 10*3/uL (ref 0.1–0.9)
NEUTROS ABS: 2.8 10*3/uL (ref 1.4–7.0)
Neutrophils: 50 %
Platelets: 231 10*3/uL (ref 150–450)
RBC: 4.98 x10E6/uL (ref 4.14–5.80)
RDW: 13.7 % (ref 12.3–15.4)
WBC: 5.5 10*3/uL (ref 3.4–10.8)

## 2018-08-30 LAB — COMPREHENSIVE METABOLIC PANEL
A/G RATIO: 2 (ref 1.2–2.2)
ALT: 34 IU/L (ref 0–44)
AST: 40 IU/L (ref 0–40)
Albumin: 4.6 g/dL (ref 3.5–5.5)
Alkaline Phosphatase: 52 IU/L (ref 39–117)
BUN / CREAT RATIO: 8 — AB (ref 9–20)
BUN: 13 mg/dL (ref 6–24)
Bilirubin Total: 0.7 mg/dL (ref 0.0–1.2)
CO2: 25 mmol/L (ref 20–29)
CREATININE: 1.64 mg/dL — AB (ref 0.76–1.27)
Calcium: 9.3 mg/dL (ref 8.7–10.2)
Chloride: 103 mmol/L (ref 96–106)
GFR calc Af Amer: 60 mL/min/{1.73_m2} (ref >59)
GFR, EST NON AFRICAN AMERICAN: 52 mL/min/{1.73_m2} — AB (ref >59)
GLUCOSE: 83 mg/dL (ref 65–99)
Globulin, Total: 2.3 g/dL (ref 1.5–4.5)
Potassium: 4.1 mmol/L (ref 3.5–5.2)
Sodium: 143 mmol/L (ref 134–144)
TOTAL PROTEIN: 6.9 g/dL (ref 6.0–8.5)

## 2018-08-30 LAB — LIPID PANEL
CHOLESTEROL TOTAL: 206 mg/dL — AB (ref 100–199)
Chol/HDL Ratio: 5.9 ratio — ABNORMAL HIGH (ref 0.0–5.0)
HDL: 35 mg/dL — AB (ref >39)
LDL Calculated: 96 mg/dL (ref 0–99)
TRIGLYCERIDES: 373 mg/dL — AB (ref 0–149)
VLDL Cholesterol Cal: 75 mg/dL — ABNORMAL HIGH (ref 5–40)

## 2018-08-30 LAB — TSH: TSH: 1.57 u[IU]/mL (ref 0.450–4.500)

## 2018-08-30 NOTE — Telephone Encounter (Signed)
You creatinine is slightly elevated, increase water intake and avoid NSAIDS. We will recheck this in 3 months. Your total cholesterol is elevated at 206 and your triglycerides are elevated at 373. Diet and exercise are important. Increase fresh fruits and vegetables, avoid fried greasy foods, and increase lean meats like chicken and fish. I want you to start taking red yeast rice 2400mg  daily. We will trial lifestyle changes and red yeast rice for the next 3 months and then recheck your cholesterol. If no improvement is made, we will discuss other treatments.

## 2019-09-21 ENCOUNTER — Other Ambulatory Visit: Payer: Self-pay

## 2019-09-24 ENCOUNTER — Other Ambulatory Visit: Payer: Self-pay

## 2019-09-24 ENCOUNTER — Ambulatory Visit (INDEPENDENT_AMBULATORY_CARE_PROVIDER_SITE_OTHER): Payer: Commercial Managed Care - PPO | Admitting: Family Medicine

## 2019-09-24 ENCOUNTER — Encounter: Payer: Self-pay | Admitting: Family Medicine

## 2019-09-24 VITALS — BP 105/68 | HR 49 | Temp 98.4°F | Resp 20 | Ht 65.0 in | Wt 198.0 lb

## 2019-09-24 DIAGNOSIS — Z0001 Encounter for general adult medical examination with abnormal findings: Secondary | ICD-10-CM

## 2019-09-24 DIAGNOSIS — Z6832 Body mass index (BMI) 32.0-32.9, adult: Secondary | ICD-10-CM | POA: Diagnosis not present

## 2019-09-24 NOTE — Patient Instructions (Signed)

## 2019-09-24 NOTE — Progress Notes (Signed)
Subjective:  Patient ID: Sean Fitzgerald, male    DOB: 1976/05/08, 43 y.o.   MRN: [id number]  Patient Care Team: Baruch Gouty, FNP as PCP - General (Family Medicine)   Chief Complaint:  Annual Exam (CPE for work )   HPI: Sean Fitzgerald is a 43 y.o. male presenting on 09/24/2019 for Annual Exam (CPE for work )   Pt presents today for his annual physical exam. He reports he is very healthy overall. Denies complaints or concerns. 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 He does not take long distance trips, only drives locally. No chest pain, shortness of breath, palpitations, or leg swelling. No arthralgias or myalgias. No concerns or complaints today. Tries to eat healthy and stay active.    Relevant past medical, surgical, family, and social history reviewed and updated as indicated.  Allergies and medications reviewed and updated. Date reviewed: Chart in Epic.   Past Medical History:  Diagnosis Date  . Phimosis     Past Surgical History:  Procedure Laterality Date  . CIRCUMCISION N/A 03/02/2013   Procedure: CIRCUMCISION ADULT;  Surgeon: Hanley Ben, MD;  Location: [facility];  Service: Urology;  Laterality: N/A;  13 MIN     Social History   Socioeconomic History  . Marital status: Married    Spouse name: Not on file  . Number of children: Not on file  . Years of education: Not on file  . Highest education level: Not on file  Occupational History  . Not on file  Social Needs  . Financial resource strain: Not on file  . Food insecurity    Worry: Not on file    Inability: Not on file  . Transportation needs    Medical: Not on file    Non-medical: Not on file  Tobacco Use  . Smoking status: Former Smoker    Packs/day: 2.00    Years: 8.00    Pack years: 16.00    Types: Cigarettes    Quit date: 11/01/1997    Years since quitting: 21.9  . Smokeless tobacco: Never Used  Substance and Sexual Activity  . Alcohol use: No  .  Drug use: No  . Sexual activity: Not on file  Lifestyle  . Physical activity    Days per week: Not on file    Minutes per session: Not on file  . Stress: Not on file  Relationships  . Social Herbalist on phone: Not on file    Gets together: Not on file    Attends religious service: Not on file    Active member of club or organization: Not on file    Attends meetings of clubs or organizations: Not on file    Relationship status: Not on file  . Intimate partner violence    Fear of current or ex partner: Not on file    Emotionally abused: Not on file    Physically abused: Not on file    Forced sexual activity: Not on file  Other Topics Concern  . Not on file  Social History Narrative  . Not on file    No outpatient encounter medications on file as of 09/24/2019.   No facility-administered encounter medications on file as of 09/24/2019.     No Known Allergies  Review of Systems  Constitutional: Negative for activity change, appetite change, chills, diaphoresis, fatigue, fever and unexpected weight change.  HENT: Negative.   Eyes: Negative.  Negative for photophobia and visual disturbance.  Respiratory: Negative for cough, chest tightness and shortness of breath.   Cardiovascular: Negative for chest pain, palpitations and leg swelling.  Gastrointestinal: Negative for abdominal pain, blood in stool, constipation, diarrhea, nausea and vomiting.  Endocrine: Negative.  Negative for cold intolerance, heat intolerance, polyphagia and polyuria.  Genitourinary: Negative for decreased urine volume, difficulty urinating, dysuria, frequency and urgency.  Musculoskeletal: Negative for arthralgias and myalgias.  Skin: Negative.   Allergic/Immunologic: Negative.   Neurological: Negative for tremors, seizures, syncope, facial asymmetry, speech difficulty, weakness, light-headedness, numbness and headaches.  Hematological: Negative.   Psychiatric/Behavioral: Negative for  confusion, hallucinations, sleep disturbance and suicidal ideas.  All other systems reviewed and are negative.       Objective:  BP 105/68   Pulse (!) 49   Temp 98.4 F (36.9 C)   Resp 20   Ht '5\' 5"'$  (1.651 m)   Wt 198 lb (89.8 kg)   SpO2 98%   BMI 32.95 kg/m    Wt Readings from Last 3 Encounters:  09/24/19 198 lb (89.8 kg)  08/29/18 191 lb (86.6 kg)  11/17/16 170 lb (77.1 kg)    Physical Exam Vitals signs and nursing note reviewed.  Constitutional:      General: He is not in acute distress.    Appearance: Normal appearance. He is well-developed and well-groomed. He is obese. He is not ill-appearing, toxic-appearing or diaphoretic.  HENT:     Head: Normocephalic and atraumatic.     Jaw: There is normal jaw occlusion.     Right Ear: Hearing, tympanic membrane, ear canal and external ear normal.     Left Ear: Hearing, tympanic membrane, ear canal and external ear normal.     Nose: Nose normal.     Mouth/Throat:     Lips: Pink.     Mouth: Mucous membranes are moist.     Pharynx: Oropharynx is clear. Uvula midline.  Eyes:     General: Lids are normal.     Extraocular Movements: Extraocular movements intact.     Conjunctiva/sclera: Conjunctivae normal.     Pupils: Pupils are equal, round, and reactive to light.  Neck:     Musculoskeletal: Normal range of motion and neck supple.     Thyroid: No thyroid mass, thyromegaly or thyroid tenderness.     Vascular: No carotid bruit or JVD.     Trachea: Trachea and phonation normal.  Cardiovascular:     Rate and Rhythm: Normal rate and regular rhythm.     Chest Wall: PMI is not displaced.     Pulses: Normal pulses.     Heart sounds: Normal heart sounds. No murmur. No friction rub. No gallop.   Pulmonary:     Effort: Pulmonary effort is normal. No respiratory distress.     Breath sounds: Normal breath sounds. No wheezing.  Abdominal:     General: Bowel sounds are normal. There is no distension or abdominal bruit.      Palpations: Abdomen is soft. There is no hepatomegaly or splenomegaly.     Tenderness: There is no abdominal tenderness. There is no right CVA tenderness or left CVA tenderness.     Hernia: No hernia is present.  Musculoskeletal: Normal range of motion.     Right lower leg: No edema.     Left lower leg: No edema.  Lymphadenopathy:     Cervical: No cervical adenopathy.  Skin:    General: Skin is warm and dry.  Capillary Refill: Capillary refill takes less than 2 seconds.     Coloration: Skin is not cyanotic, jaundiced or pale.     Findings: No rash.  Neurological:     General: No focal deficit present.     Mental Status: He is alert and oriented to person, place, and time.     Cranial Nerves: Cranial nerves are intact. No cranial nerve deficit.     Sensory: Sensation is intact. No sensory deficit.     Motor: Motor function is intact. No weakness.     Coordination: Coordination is intact. Coordination normal.     Gait: Gait is intact. Gait normal.     Deep Tendon Reflexes: Reflexes are normal and symmetric. Reflexes normal.  Psychiatric:        Attention and Perception: Attention and perception normal.        Mood and Affect: Mood and affect normal.        Speech: Speech normal.        Behavior: Behavior normal. Behavior is cooperative.        Thought Content: Thought content normal.        Cognition and Memory: Cognition and memory normal.        Judgment: Judgment normal.     Results for orders placed or performed in visit on 08/29/18  Comprehensive metabolic panel  Result Value Ref Range   Glucose 83 65 - 99 mg/dL   BUN 13 6 - 24 mg/dL   Creatinine, Ser 1.64 (H) 0.76 - 1.27 mg/dL   GFR calc non Af Amer 52 (L) >59 mL/min/1.73   GFR calc Af Amer 60 >59 mL/min/1.73   BUN/Creatinine Ratio 8 (L) 9 - 20   Sodium 143 134 - 144 mmol/L   Potassium 4.1 3.5 - 5.2 mmol/L   Chloride 103 96 - 106 mmol/L   CO2 25 20 - 29 mmol/L   Calcium 9.3 8.7 - 10.2 mg/dL   Total Protein 6.9  6.0 - 8.5 g/dL   Albumin 4.6 3.5 - 5.5 g/dL   Globulin, Total 2.3 1.5 - 4.5 g/dL   Albumin/Globulin Ratio 2.0 1.2 - 2.2   Bilirubin Total 0.7 0.0 - 1.2 mg/dL   Alkaline Phosphatase 52 39 - 117 IU/L   AST 40 0 - 40 IU/L   ALT 34 0 - 44 IU/L  Lipid panel  Result Value Ref Range   Cholesterol, Total 206 (H) 100 - 199 mg/dL   Triglycerides 373 (H) 0 - 149 mg/dL   HDL 35 (L) >39 mg/dL   VLDL Cholesterol Cal 75 (H) 5 - 40 mg/dL   LDL Calculated 96 0 - 99 mg/dL   Chol/HDL Ratio 5.9 (H) 0.0 - 5.0 ratio  CBC with Differential  Result Value Ref Range   WBC 5.5 3.4 - 10.8 x10E3/uL   RBC 4.98 4.14 - 5.80 x10E6/uL   Hemoglobin 14.4 13.0 - 17.7 g/dL   Hematocrit 44.1 37.5 - 51.0 %   MCV 89 79 - 97 fL   MCH 28.9 26.6 - 33.0 pg   MCHC 32.7 31.5 - 35.7 g/dL   RDW 13.7 12.3 - 15.4 %   Platelets 231 150 - 450 x10E3/uL   Neutrophils 50 Not Estab. %   Lymphs 38 Not Estab. %   Monocytes 9 Not Estab. %   Eos 1 Not Estab. %   Basos 1 Not Estab. %   Neutrophils Absolute 2.8 1.4 - 7.0 x10E3/uL   Lymphocytes Absolute 2.1 0.7 - 3.1 x10E3/uL  Monocytes Absolute 0.5 0.1 - 0.9 x10E3/uL   EOS (ABSOLUTE) 0.1 0.0 - 0.4 x10E3/uL   Basophils Absolute 0.0 0.0 - 0.2 x10E3/uL   Immature Granulocytes 1 Not Estab. %   Immature Grans (Abs) 0.0 0.0 - 0.1 x10E3/uL  TSH  Result Value Ref Range   TSH 1.570 0.450 - 4.500 uIU/mL       Pertinent labs & imaging results that were available during my care of the patient were reviewed by me and considered in my medical decision making.  Assessment & Plan:  Sean Fitzgerald was seen today for annual exam.  Diagnoses and all orders for this visit:  Encounter for general adult medical examination with abnormal findings -     CMP14+EGFR -     CBC with Differential/Platelet -     Lipid panel -     Thyroid Panel With TSH  BMI 32.0-32.9,adult -     CMP14+EGFR -     CBC with Differential/Platelet -     Lipid panel -     Thyroid Panel With TSH  Diet and exercise  encouraged. Health maintenance discussed in detail. Declined Tdap today.    Continue all other maintenance medications.  Follow up plan: Return in 1 year (on 09/23/2020), or if symptoms worsen or fail to improve.  Continue healthy lifestyle choices, including diet (rich in fruits, vegetables, and lean proteins, and low in salt and simple carbohydrates) and exercise (at least 30 minutes of moderate physical activity daily).  Educational handout given for health maintenance.   The above assessment and management plan was discussed with the patient. The patient verbalized understanding of and has agreed to the management plan. Patient is aware to call the clinic if they develop any new symptoms or if symptoms persist or worsen. Patient is aware when to return to the clinic for a follow-up visit. Patient educated on when it is appropriate to go to the emergency department.   Monia Pouch, FNP-C [facility] [phone]

## 2019-09-25 LAB — CMP14+EGFR
ALT: 27 IU/L (ref 0–44)
AST: 27 IU/L (ref 0–40)
Albumin/Globulin Ratio: 1.9 (ref 1.2–2.2)
Albumin: 4.5 g/dL (ref 4.0–5.0)
Alkaline Phosphatase: 58 IU/L (ref 39–117)
BUN/Creatinine Ratio: 9 (ref 9–20)
BUN: 16 mg/dL (ref 6–24)
Bilirubin Total: 1 mg/dL (ref 0.0–1.2)
CO2: 25 mmol/L (ref 20–29)
Calcium: 9.4 mg/dL (ref 8.7–10.2)
Chloride: 103 mmol/L (ref 96–106)
Creatinine, Ser: 1.85 mg/dL — ABNORMAL HIGH (ref 0.76–1.27)
GFR calc Af Amer: 51 mL/min/{1.73_m2} — ABNORMAL LOW (ref >59)
GFR calc non Af Amer: 44 mL/min/{1.73_m2} — ABNORMAL LOW (ref >59)
Globulin, Total: 2.4 g/dL (ref 1.5–4.5)
Glucose: 76 mg/dL (ref 65–99)
Potassium: 4.2 mmol/L (ref 3.5–5.2)
Sodium: 141 mmol/L (ref 134–144)
Total Protein: 6.9 g/dL (ref 6.0–8.5)

## 2019-09-25 LAB — CBC WITH DIFFERENTIAL/PLATELET
Basophils Absolute: 0.1 10*3/uL (ref 0.0–0.2)
Basos: 1 %
EOS (ABSOLUTE): 0.1 10*3/uL (ref 0.0–0.4)
Eos: 2 %
Hematocrit: 45.3 % (ref 37.5–51.0)
Hemoglobin: 15.1 g/dL (ref 13.0–17.7)
Immature Grans (Abs): 0 10*3/uL (ref 0.0–0.1)
Immature Granulocytes: 0 %
Lymphocytes Absolute: 2.6 10*3/uL (ref 0.7–3.1)
Lymphs: 43 %
MCH: 29.7 pg (ref 26.6–33.0)
MCHC: 33.3 g/dL (ref 31.5–35.7)
MCV: 89 fL (ref 79–97)
Monocytes Absolute: 0.7 10*3/uL (ref 0.1–0.9)
Monocytes: 12 %
Neutrophils Absolute: 2.5 10*3/uL (ref 1.4–7.0)
Neutrophils: 42 %
Platelets: 252 10*3/uL (ref 150–450)
RBC: 5.08 x10E6/uL (ref 4.14–5.80)
RDW: 13.8 % (ref 11.6–15.4)
WBC: 6 10*3/uL (ref 3.4–10.8)

## 2019-09-25 LAB — LIPID PANEL
Chol/HDL Ratio: 6.3 ratio — ABNORMAL HIGH (ref 0.0–5.0)
Cholesterol, Total: 219 mg/dL — ABNORMAL HIGH (ref 100–199)
HDL: 35 mg/dL — ABNORMAL LOW (ref >39)
LDL Chol Calc (NIH): 124 mg/dL — ABNORMAL HIGH (ref 0–99)
Triglycerides: 340 mg/dL — ABNORMAL HIGH (ref 0–149)
VLDL Cholesterol Cal: 60 mg/dL — ABNORMAL HIGH (ref 5–40)

## 2019-09-25 LAB — THYROID PANEL WITH TSH
Free Thyroxine Index: 2.1 (ref 1.2–4.9)
T3 Uptake Ratio: 30 % (ref 24–39)
T4, Total: 7.1 ug/dL (ref 4.5–12.0)
TSH: 1.44 u[IU]/mL (ref 0.450–4.500)

## 2019-09-26 ENCOUNTER — Other Ambulatory Visit: Payer: Self-pay | Admitting: *Deleted

## 2019-09-26 DIAGNOSIS — N289 Disorder of kidney and ureter, unspecified: Secondary | ICD-10-CM

## 2019-10-01 ENCOUNTER — Other Ambulatory Visit: Payer: Self-pay

## 2019-10-01 ENCOUNTER — Other Ambulatory Visit: Payer: Commercial Managed Care - PPO

## 2019-10-01 DIAGNOSIS — N289 Disorder of kidney and ureter, unspecified: Secondary | ICD-10-CM

## 2019-10-02 LAB — BMP8+EGFR
BUN/Creatinine Ratio: 8 — ABNORMAL LOW (ref 9–20)
BUN: 14 mg/dL (ref 6–24)
CO2: 26 mmol/L (ref 20–29)
Calcium: 9.6 mg/dL (ref 8.7–10.2)
Chloride: 103 mmol/L (ref 96–106)
Creatinine, Ser: 1.86 mg/dL — ABNORMAL HIGH (ref 0.76–1.27)
GFR calc Af Amer: 51 mL/min/{1.73_m2} — ABNORMAL LOW (ref >59)
GFR calc non Af Amer: 44 mL/min/{1.73_m2} — ABNORMAL LOW (ref >59)
Glucose: 68 mg/dL (ref 65–99)
Potassium: 4.1 mmol/L (ref 3.5–5.2)
Sodium: 142 mmol/L (ref 134–144)

## 2019-10-03 MED ORDER — ENALAPRIL MALEATE 2.5 MG PO TABS: 2.5000 mg | ORAL_TABLET | Freq: Every day | ORAL | 1 refills | Status: AC

## 2019-10-16 ENCOUNTER — Other Ambulatory Visit: Payer: Self-pay

## 2019-10-17 ENCOUNTER — Encounter: Payer: Self-pay | Admitting: Family Medicine

## 2019-10-17 ENCOUNTER — Ambulatory Visit (INDEPENDENT_AMBULATORY_CARE_PROVIDER_SITE_OTHER): Payer: Commercial Managed Care - PPO | Admitting: Family Medicine

## 2019-10-17 VITALS — BP 106/64 | HR 72 | Temp 99.0°F | Resp 20 | Ht 65.0 in | Wt 201.0 lb

## 2019-10-17 DIAGNOSIS — N289 Disorder of kidney and ureter, unspecified: Secondary | ICD-10-CM

## 2019-10-17 LAB — URINALYSIS, COMPLETE
Bilirubin, UA: NEGATIVE
Glucose, UA: NEGATIVE
Ketones, UA: NEGATIVE
Leukocytes,UA: NEGATIVE
Nitrite, UA: NEGATIVE
Specific Gravity, UA: 1.025 (ref 1.005–1.030)
Urobilinogen, Ur: 0.2 mg/dL (ref 0.2–1.0)
pH, UA: 6 (ref 5.0–7.5)

## 2019-10-17 LAB — MICROSCOPIC EXAMINATION
Bacteria, UA: NONE SEEN
Epithelial Cells (non renal): NONE SEEN /hpf (ref 0–10)
RBC, Urine: NONE SEEN /hpf (ref 0–2)
Renal Epithel, UA: NONE SEEN /hpf
WBC, UA: NONE SEEN /hpf (ref 0–5)

## 2019-10-17 NOTE — Progress Notes (Signed)
Subjective:  Patient ID: Sean Fitzgerald, male    DOB: 07-13-1976, 43 y.o.   MRN: [id number]  Patient Care Team: Baruch Gouty, FNP as PCP - General (Family Medicine)   Chief Complaint:  Hypertension (2 week follow up )   HPI: Sean Fitzgerald is a 43 y.o. male presenting on 10/17/2019 for Hypertension (2 week follow up )   1. Abnormal kidney function Pt presents today to follow up for abnormal kidney function labs. Pt was started on enalapril 2.5 mg daily and has been taking as prescribed. States he is trying to drink more water. Denies side effects from medications. No swelling, confusion, weakness, weight changes, or fatigue. No shortness of breath.      Relevant past medical, surgical, family, and social history reviewed and updated as indicated.  Allergies and medications reviewed and updated. Date reviewed: Chart in Epic.   Past Medical History:  Diagnosis Date  . Phimosis     Past Surgical History:  Procedure Laterality Date  . CIRCUMCISION N/A 03/02/2013   Procedure: CIRCUMCISION ADULT;  Surgeon: Hanley Ben, MD;  Location: [facility];  Service: Urology;  Laterality: N/A;  82 MIN     Social History   Socioeconomic History  . Marital status: Married    Spouse name: Not on file  . Number of children: Not on file  . Years of education: Not on file  . Highest education level: Not on file  Occupational History  . Not on file  Tobacco Use  . Smoking status: Former Smoker    Packs/day: 2.00    Years: 8.00    Pack years: 16.00    Types: Cigarettes    Quit date: 11/01/1997    Years since quitting: 21.9  . Smokeless tobacco: Never Used  Substance and Sexual Activity  . Alcohol use: No  . Drug use: No  . Sexual activity: Not on file  Other Topics Concern  . Not on file  Social History Narrative  . Not on file   Social Determinants of Health   Financial Resource Strain:   . Difficulty of Paying Living Expenses: Not on file  Food  Insecurity:   . Worried About Charity fundraiser in the Last Year: Not on file  . Ran Out of Food in the Last Year: Not on file  Transportation Needs:   . Lack of Transportation (Medical): Not on file  . Lack of Transportation (Non-Medical): Not on file  Physical Activity:   . Days of Exercise per Week: Not on file  . Minutes of Exercise per Session: Not on file  Stress:   . Feeling of Stress : Not on file  Social Connections:   . Frequency of Communication with Friends and Family: Not on file  . Frequency of Social Gatherings with Friends and Family: Not on file  . Attends Religious Services: Not on file  . Active Member of Clubs or Organizations: Not on file  . Attends Archivist Meetings: Not on file  . Marital Status: Not on file  Intimate Partner Violence:   . Fear of Current or Ex-Partner: Not on file  . Emotionally Abused: Not on file  . Physically Abused: Not on file  . Sexually Abused: Not on file    Outpatient Encounter Medications as of 10/17/2019  Medication Sig  . enalapril (VASOTEC) 2.5 MG tablet Take 1 tablet (2.5 mg total) by mouth daily.   No facility-administered encounter medications on file as of  10/17/2019.    No Known Allergies  Review of Systems  Constitutional: Negative for activity change, appetite change, chills, diaphoresis, fatigue, fever and unexpected weight change.  HENT: Negative.   Eyes: Negative.  Negative for photophobia and visual disturbance.  Respiratory: Negative for cough, chest tightness and shortness of breath.   Cardiovascular: Negative for chest pain, palpitations and leg swelling.  Gastrointestinal: Negative for abdominal distention, abdominal pain, blood in stool, constipation, diarrhea, nausea and vomiting.  Endocrine: Negative.   Genitourinary: Negative for decreased urine volume, difficulty urinating, dysuria, frequency, hematuria, penile swelling, scrotal swelling and urgency.  Musculoskeletal: Negative for  arthralgias and myalgias.  Skin: Negative.   Allergic/Immunologic: Negative.   Neurological: Negative for dizziness, tremors, seizures, syncope, facial asymmetry, speech difficulty, weakness, light-headedness, numbness and headaches.  Hematological: Negative.   Psychiatric/Behavioral: Negative for confusion, hallucinations, sleep disturbance and suicidal ideas.  All other systems reviewed and are negative.       Objective:  BP 106/64   Pulse 72   Temp 99 F (37.2 C)   Resp 20   Ht '5\' 5"'$  (1.651 m)   Wt 201 lb (91.2 kg)   SpO2 98%   BMI 33.45 kg/m    Wt Readings from Last 3 Encounters:  10/17/19 201 lb (91.2 kg)  09/24/19 198 lb (89.8 kg)  08/29/18 191 lb (86.6 kg)    Physical Exam Vitals and nursing note reviewed.  Constitutional:      General: He is not in acute distress.    Appearance: Normal appearance. He is well-developed and well-groomed. He is not ill-appearing, toxic-appearing or diaphoretic.  HENT:     Head: Normocephalic and atraumatic.     Jaw: There is normal jaw occlusion.     Right Ear: Hearing normal.     Left Ear: Hearing normal.     Nose: Nose normal.     Mouth/Throat:     Lips: Pink.     Mouth: Mucous membranes are moist.     Pharynx: Oropharynx is clear. Uvula midline.  Eyes:     General: Lids are normal.     Extraocular Movements: Extraocular movements intact.     Conjunctiva/sclera: Conjunctivae normal.     Pupils: Pupils are equal, round, and reactive to light.  Neck:     Thyroid: No thyroid mass, thyromegaly or thyroid tenderness.     Vascular: No carotid bruit or JVD.     Trachea: Trachea and phonation normal.  Cardiovascular:     Rate and Rhythm: Normal rate and regular rhythm.     Chest Wall: PMI is not displaced.     Pulses: Normal pulses.     Heart sounds: Normal heart sounds. No murmur. No friction rub. No gallop.   Pulmonary:     Effort: Pulmonary effort is normal. No respiratory distress.     Breath sounds: Normal breath  sounds. No wheezing.  Abdominal:     General: Bowel sounds are normal. There is no distension or abdominal bruit.     Palpations: Abdomen is soft. There is no hepatomegaly or splenomegaly.     Tenderness: There is no abdominal tenderness. There is no right CVA tenderness or left CVA tenderness.     Hernia: No hernia is present.  Musculoskeletal:        General: Normal range of motion.     Cervical back: Normal range of motion and neck supple.     Right lower leg: No edema.     Left lower leg: No edema.  Lymphadenopathy:     Cervical: No cervical adenopathy.  Skin:    General: Skin is warm and dry.     Capillary Refill: Capillary refill takes less than 2 seconds.     Coloration: Skin is not cyanotic, jaundiced or pale.     Findings: No rash.  Neurological:     General: No focal deficit present.     Mental Status: He is alert and oriented to person, place, and time.     Cranial Nerves: Cranial nerves are intact. No cranial nerve deficit.     Sensory: Sensation is intact. No sensory deficit.     Motor: Motor function is intact. No weakness.     Coordination: Coordination is intact. Coordination normal.     Gait: Gait is intact. Gait normal.     Deep Tendon Reflexes: Reflexes are normal and symmetric. Reflexes normal.  Psychiatric:        Attention and Perception: Attention and perception normal.        Mood and Affect: Mood and affect normal.        Speech: Speech normal.        Behavior: Behavior normal. Behavior is cooperative.        Thought Content: Thought content normal.        Cognition and Memory: Cognition and memory normal.        Judgment: Judgment normal.     Results for orders placed or performed in visit on 10/01/19  BMP8+EGFR  Result Value Ref Range   Glucose 68 65 - 99 mg/dL   BUN 14 6 - 24 mg/dL   Creatinine, Ser 1.86 (H) 0.76 - 1.27 mg/dL   GFR calc non Af Amer 44 (L) >59 mL/min/1.73   GFR calc Af Amer 51 (L) >59 mL/min/1.73   BUN/Creatinine Ratio 8 (L) 9  - 20   Sodium 142 134 - 144 mmol/L   Potassium 4.1 3.5 - 5.2 mmol/L   Chloride 103 96 - 106 mmol/L   CO2 26 20 - 29 mmol/L   Calcium 9.6 8.7 - 10.2 mg/dL       Pertinent labs & imaging results that were available during my care of the patient were reviewed by me and considered in my medical decision making.  Assessment & Plan:  Jedi was seen today for hypertension.  Diagnoses and all orders for this visit:  Abnormal kidney function Has started enalapril as prescribed. Will recheck labs today. Pt aware to stay adequately hydrated and to avoid NSAIDs. Will discuss further treatment once labs result.  -     BMP8+EGFR -     urinalysis- dip and micro     Continue all other maintenance medications.  Follow up plan: Return in about 3 months (around 01/15/2020), or if symptoms worsen or fail to improve, for renal function.  Continue healthy lifestyle choices, including diet (rich in fruits, vegetables, and lean proteins, and low in salt and simple carbohydrates) and exercise (at least 30 minutes of moderate physical activity daily).   The above assessment and management plan was discussed with the patient. The patient verbalized understanding of and has agreed to the management plan. Patient is aware to call the clinic if they develop any new symptoms or if symptoms persist or worsen. Patient is aware when to return to the clinic for a follow-up visit. Patient educated on when it is appropriate to go to the emergency department.   Monia Pouch, FNP-C [facility] [phone]

## 2019-10-18 LAB — BMP8+EGFR
BUN/Creatinine Ratio: 8 — ABNORMAL LOW (ref 9–20)
BUN: 13 mg/dL (ref 6–24)
CO2: 26 mmol/L (ref 20–29)
Calcium: 9.6 mg/dL (ref 8.7–10.2)
Chloride: 103 mmol/L (ref 96–106)
Creatinine, Ser: 1.71 mg/dL — ABNORMAL HIGH (ref 0.76–1.27)
GFR calc Af Amer: 56 mL/min/{1.73_m2} — ABNORMAL LOW (ref >59)
GFR calc non Af Amer: 49 mL/min/{1.73_m2} — ABNORMAL LOW (ref >59)
Glucose: 62 mg/dL — ABNORMAL LOW (ref 65–99)
Potassium: 4.2 mmol/L (ref 3.5–5.2)
Sodium: 142 mmol/L (ref 134–144)

## 2019-10-18 NOTE — Progress Notes (Signed)
Urinalysis with 1+ protein and trace blood. Increase water intake. Will recheck in 3 months.  Kidney function is still declined. Need to continue enalapril and increase water intake as discussed. Avoid NSAIDs such as ibuprofen and aleve. Will recheck in 3 months. Report any new symptoms such as swelling in hands and feet, unexplained weight gain, confusion, weakness, or fatigue.

## 2020-01-17 ENCOUNTER — Ambulatory Visit (INDEPENDENT_AMBULATORY_CARE_PROVIDER_SITE_OTHER): Payer: BC Managed Care – PPO | Admitting: Family Medicine

## 2020-01-17 ENCOUNTER — Other Ambulatory Visit: Payer: Self-pay

## 2020-01-17 ENCOUNTER — Encounter: Payer: Self-pay | Admitting: Family Medicine

## 2020-01-17 VITALS — BP 107/69 | HR 50 | Temp 98.9°F | Resp 20 | Ht 65.0 in | Wt 200.0 lb

## 2020-01-17 DIAGNOSIS — Z6833 Body mass index (BMI) 33.0-33.9, adult: Secondary | ICD-10-CM | POA: Diagnosis not present

## 2020-01-17 DIAGNOSIS — R809 Proteinuria, unspecified: Secondary | ICD-10-CM

## 2020-01-17 DIAGNOSIS — N289 Disorder of kidney and ureter, unspecified: Secondary | ICD-10-CM | POA: Diagnosis not present

## 2020-01-17 DIAGNOSIS — Z6831 Body mass index (BMI) 31.0-31.9, adult: Secondary | ICD-10-CM | POA: Diagnosis not present

## 2020-01-17 DIAGNOSIS — R801 Persistent proteinuria, unspecified: Secondary | ICD-10-CM

## 2020-01-17 LAB — URINALYSIS
Bilirubin, UA: NEGATIVE
Glucose, UA: NEGATIVE
Ketones, UA: NEGATIVE
Leukocytes,UA: NEGATIVE
Nitrite, UA: NEGATIVE
RBC, UA: NEGATIVE
Specific Gravity, UA: 1.02 (ref 1.005–1.030)
Urobilinogen, Ur: 0.2 mg/dL (ref 0.2–1.0)
pH, UA: 6.5 (ref 5.0–7.5)

## 2020-01-17 NOTE — Progress Notes (Signed)
Subjective:  Patient ID: Sean Fitzgerald, male    DOB: [birth year], 44 y.o.   MRN: [id number]  Patient Care Team: Baruch Gouty, FNP as PCP - General (Family Medicine)   Chief Complaint:  Medical Management of Chronic Issues ( 29mo   HPI: RCordale Manerais a 44y.o. male presenting on 01/17/2020 for Medical Management of Chronic Issues ( 331mo  1. Abnormal kidney function Has initiated enalapril as prescribed and increased water intake. States he did void more frequently for the first few days but then returned to normal voiding. Has been eating healthier. Denies swelling, weakness, fatigue, confusion, or SHOB.     Relevant past medical, surgical, family, and social history reviewed and updated as indicated.  Allergies and medications reviewed and updated. Date reviewed: Chart in Epic.   Past Medical History:  Diagnosis Date  . Phimosis     Past Surgical History:  Procedure Laterality Date  . CIRCUMCISION N/A 03/02/2013   Procedure: CIRCUMCISION ADULT;  Surgeon: MaHanley BenMD;  [facility] Service[facility] Urology;  Laterality: N/A;  [id number]     Social History   Socioeconomic History  . Marital status: Married    Spouse name: Not on file  . Number of children: Not on file  . Years of education: Not on file  . Highest education level: Not on file  Occupational History  . Not on file  Tobacco Use  . Smoking status: Former Smoker    Packs/day: 2.00    Years: 8.00    Pack years: 16.00    Types: Cigarettes    Quit date: 11/01/1997    Years since quitting: 22.2  . Smokeless tobacco: Never Used  Substance and Sexual Activity  . Alcohol use: No  . Drug use: No  . Sexual activity: Not on file  Other Topics Concern  . Not on file  Social History Narrative  . Not on file   Social Determinants of Health   Financial Resource Strain:   . Difficulty of Paying Living Expenses:   Food Insecurity:   . Worried About RuCharity fundraisern the  Last Year:   . RaArboriculturistn the Last Year:   Transportation Needs:   . LaFilm/video editorMedical):   . Marland Kitchenack of Transportation (Non-Medical):   Physical Activity:   . Days of Exercise per Week:   . Minutes of Exercise per Session:   Stress:   . Feeling of Stress :   Social Connections:   . Frequency of Communication with Friends and Family:   . Frequency of Social Gatherings with Friends and Family:   . Attends Religious Services:   . Active Member of Clubs or Organizations:   . Attends ClArchivisteetings:   . Marland Kitchenarital Status:   Intimate Partner Violence:   . Fear of Current or Ex-Partner:   . Emotionally Abused:   . Marland Kitchenhysically Abused:   . Sexually Abused:     Outpatient Encounter Medications as of 01/17/2020  Medication Sig  . enalapril (VASOTEC) 2.5 MG tablet Take 1 tablet (2.5 mg total) by mouth daily.   No facility-administered encounter medications on file as of 01/17/2020.    No Known Allergies  Review of Systems  Constitutional: Negative for activity change, appetite change, chills, diaphoresis, fatigue, fever and unexpected weight change.  HENT: Negative.   Eyes: Negative.  Negative for photophobia and visual disturbance.  Respiratory: Negative for cough, chest tightness  and shortness of breath.   Cardiovascular: Negative for chest pain, palpitations and leg swelling.  Gastrointestinal: Negative for abdominal distention, abdominal pain, blood in stool, constipation, diarrhea, nausea and vomiting.  Endocrine: Negative.  Negative for polydipsia, polyphagia and polyuria.  Genitourinary: Negative for decreased urine volume, difficulty urinating, dysuria, frequency and urgency.  Musculoskeletal: Negative for arthralgias and myalgias.  Skin: Negative.   Allergic/Immunologic: Negative.   Neurological: Negative for dizziness, tremors, seizures, syncope, facial asymmetry, speech difficulty, weakness, light-headedness, numbness and headaches.    Hematological: Negative.   Psychiatric/Behavioral: Negative for confusion, hallucinations, sleep disturbance and suicidal ideas.  All other systems reviewed and are negative.       Objective:  BP 107/69   Pulse (!) 50   Temp 98.9 F (37.2 C)   Resp 20   Ht '5\' 5"'$  (1.651 m)   Wt 200 lb (90.7 kg)   SpO2 97%   BMI 33.28 kg/m    Wt Readings from Last 3 Encounters:  01/17/20 200 lb (90.7 kg)  10/17/19 201 lb (91.2 kg)  09/24/19 198 lb (89.8 kg)    Physical Exam Vitals and nursing note reviewed.  Constitutional:      General: He is not in acute distress.    Appearance: Normal appearance. He is well-developed and well-groomed. He is obese. He is not ill-appearing, toxic-appearing or diaphoretic.  HENT:     Head: Normocephalic and atraumatic.     Jaw: There is normal jaw occlusion.     Right Ear: Hearing normal.     Left Ear: Hearing normal.     Nose: Nose normal.     Mouth/Throat:     Lips: Pink.     Mouth: Mucous membranes are moist.     Pharynx: Oropharynx is clear. Uvula midline.  Eyes:     General: Lids are normal.     Extraocular Movements: Extraocular movements intact.     Conjunctiva/sclera: Conjunctivae normal.     Pupils: Pupils are equal, round, and reactive to light.  Neck:     Thyroid: No thyroid mass, thyromegaly or thyroid tenderness.     Vascular: No carotid bruit or JVD.     Trachea: Trachea and phonation normal.  Cardiovascular:     Rate and Rhythm: Normal rate and regular rhythm.     Chest Wall: PMI is not displaced.     Pulses: Normal pulses.     Heart sounds: Normal heart sounds. No murmur. No friction rub. No gallop.   Pulmonary:     Effort: Pulmonary effort is normal. No respiratory distress.     Breath sounds: Normal breath sounds. No wheezing.  Abdominal:     General: Bowel sounds are normal. There is no distension or abdominal bruit.     Palpations: Abdomen is soft. There is no hepatomegaly or splenomegaly.     Tenderness: There is no  abdominal tenderness. There is no right CVA tenderness or left CVA tenderness.     Hernia: No hernia is present.  Musculoskeletal:        General: Normal range of motion.     Cervical back: Normal range of motion and neck supple.     Right lower leg: No edema.     Left lower leg: No edema.  Lymphadenopathy:     Cervical: No cervical adenopathy.  Skin:    General: Skin is warm and dry.     Capillary Refill: Capillary refill takes less than 2 seconds.     Coloration: Skin is not cyanotic,  jaundiced or pale.     Findings: No rash.  Neurological:     General: No focal deficit present.     Mental Status: He is alert and oriented to person, place, and time.     Cranial Nerves: Cranial nerves are intact. No cranial nerve deficit.     Sensory: Sensation is intact. No sensory deficit.     Motor: Motor function is intact. No weakness.     Coordination: Coordination is intact. Coordination normal.     Gait: Gait is intact. Gait normal.     Deep Tendon Reflexes: Reflexes are normal and symmetric. Reflexes normal.  Psychiatric:        Attention and Perception: Attention and perception normal.        Mood and Affect: Mood and affect normal.        Speech: Speech normal.        Behavior: Behavior normal. Behavior is cooperative.        Thought Content: Thought content normal.        Cognition and Memory: Cognition and memory normal.        Judgment: Judgment normal.     Results for orders placed or performed in visit on 10/17/19  Microscopic Examination   URINE  Result Value Ref Range   WBC, UA None seen 0 - 5 /hpf   RBC None seen 0 - 2 /hpf   Epithelial Cells (non renal) None seen 0 - 10 /hpf   Renal Epithel, UA None seen None seen /hpf   Bacteria, UA None seen None seen/Few  BMP8+EGFR  Result Value Ref Range   Glucose 62 (L) 65 - 99 mg/dL   BUN 13 6 - 24 mg/dL   Creatinine, Ser 1.71 (H) 0.76 - 1.27 mg/dL   GFR calc non Af Amer 49 (L) >59 mL/min/1.73   GFR calc Af Amer 56 (L) >59  mL/min/1.73   BUN/Creatinine Ratio 8 (L) 9 - 20   Sodium 142 134 - 144 mmol/L   Potassium 4.2 3.5 - 5.2 mmol/L   Chloride 103 96 - 106 mmol/L   CO2 26 20 - 29 mmol/L   Calcium 9.6 8.7 - 10.2 mg/dL  urinalysis- dip and micro  Result Value Ref Range   Specific Gravity, UA 1.025 1.005 - 1.030   pH, UA 6.0 5.0 - 7.5   Color, UA Yellow Yellow   Appearance Ur Clear Clear   Leukocytes,UA Negative Negative   Protein,UA 1+ (A) Negative/Trace   Glucose, UA Negative Negative   Ketones, UA Negative Negative   RBC, UA Trace (A) Negative   Bilirubin, UA Negative Negative   Urobilinogen, Ur 0.2 0.2 - 1.0 mg/dL   Nitrite, UA Negative Negative   Microscopic Examination See below:        Pertinent labs & imaging results that were available during my care of the patient were reviewed by me and considered in my medical decision making.  Assessment & Plan:  Ted was seen today for medical management of chronic issues.  Diagnoses and all orders for this visit:  Abnormal kidney function Has been taking enalapril as prescribed. Will check below today. If renal function remains declined, will refer to nephrology.  -     CBC with Differential/Platelet -     CMP14+EGFR -     Microalbumin / creatinine urine ratio -     Urinalysis  BMI 33.0-33.9,adult Diet and exercise encouraged. Labs pending.  -     CBC with Differential/Platelet -  CMP14+EGFR     Continue all other maintenance medications.  Follow up plan: Return if symptoms worsen or fail to improve.  Continue healthy lifestyle choices, including diet (rich in fruits, vegetables, and lean proteins, and low in salt and simple carbohydrates) and exercise (at least 30 minutes of moderate physical activity daily).   The above assessment and management plan was discussed with the patient. The patient verbalized understanding of and has agreed to the management plan. Patient is aware to call the clinic if they develop any new symptoms or  if symptoms persist or worsen. Patient is aware when to return to the clinic for a follow-up visit. Patient educated on when it is appropriate to go to the emergency department.   Monia Pouch, FNP-C [facility] [phone]

## 2020-01-18 LAB — MICROALBUMIN / CREATININE URINE RATIO
Creatinine, Urine: 128.5 mg/dL
Microalb/Creat Ratio: 99 mg/g creat — ABNORMAL HIGH (ref 0–29)
Microalbumin, Urine: 127.1 ug/mL

## 2020-01-18 LAB — CMP14+EGFR
ALT: 31 IU/L (ref 0–44)
AST: 32 IU/L (ref 0–40)
Albumin/Globulin Ratio: 1.8 (ref 1.2–2.2)
Albumin: 4.6 g/dL (ref 4.0–5.0)
Alkaline Phosphatase: 55 IU/L (ref 39–117)
BUN/Creatinine Ratio: 8 — ABNORMAL LOW (ref 9–20)
BUN: 15 mg/dL (ref 6–24)
Bilirubin Total: 0.7 mg/dL (ref 0.0–1.2)
CO2: 24 mmol/L (ref 20–29)
Calcium: 9.8 mg/dL (ref 8.7–10.2)
Chloride: 104 mmol/L (ref 96–106)
Creatinine, Ser: 1.86 mg/dL — ABNORMAL HIGH (ref 0.76–1.27)
GFR calc Af Amer: 50 mL/min/{1.73_m2} — ABNORMAL LOW (ref >59)
GFR calc non Af Amer: 44 mL/min/{1.73_m2} — ABNORMAL LOW (ref >59)
Globulin, Total: 2.5 g/dL (ref 1.5–4.5)
Glucose: 83 mg/dL (ref 65–99)
Potassium: 4.4 mmol/L (ref 3.5–5.2)
Sodium: 142 mmol/L (ref 134–144)
Total Protein: 7.1 g/dL (ref 6.0–8.5)

## 2020-01-18 LAB — CBC WITH DIFFERENTIAL/PLATELET
Basophils Absolute: 0.1 10*3/uL (ref 0.0–0.2)
Basos: 1 %
EOS (ABSOLUTE): 0.3 10*3/uL (ref 0.0–0.4)
Eos: 4 %
Hematocrit: 44.4 % (ref 37.5–51.0)
Hemoglobin: 14.8 g/dL (ref 13.0–17.7)
Immature Grans (Abs): 0 10*3/uL (ref 0.0–0.1)
Immature Granulocytes: 0 %
Lymphocytes Absolute: 3.4 10*3/uL — ABNORMAL HIGH (ref 0.7–3.1)
Lymphs: 44 %
MCH: 29.4 pg (ref 26.6–33.0)
MCHC: 33.3 g/dL (ref 31.5–35.7)
MCV: 88 fL (ref 79–97)
Monocytes Absolute: 0.6 10*3/uL (ref 0.1–0.9)
Monocytes: 8 %
Neutrophils Absolute: 3.3 10*3/uL (ref 1.4–7.0)
Neutrophils: 43 %
Platelets: 261 10*3/uL (ref 150–450)
RBC: 5.03 x10E6/uL (ref 4.14–5.80)
RDW: 13.8 % (ref 11.6–15.4)
WBC: 7.7 10*3/uL (ref 3.4–10.8)

## 2020-01-20 NOTE — Addendum Note (Signed)
Addended by: Sonny Masters on: 01/20/2020 07:10 PM   Modules accepted: Orders

## 2020-01-20 NOTE — Progress Notes (Signed)
Renal function remains declined. Urine Microalbumin is high at 127.1 and proteinuria is still present. Will refer to nephrology for further evaluation.

## 2020-01-21 NOTE — Progress Notes (Signed)
PATIENT AWARE

## 2020-01-30 ENCOUNTER — Telehealth: Payer: Self-pay | Admitting: Family Medicine

## 2020-01-30 NOTE — Telephone Encounter (Signed)
Faxed insurance card to [facility]

## 2020-02-08 ENCOUNTER — Telehealth: Payer: Self-pay | Admitting: Family Medicine

## 2020-02-08 NOTE — Telephone Encounter (Signed)
Reach out to see if pt would like to reschedule this appointment.

## 2020-02-08 NOTE — Telephone Encounter (Signed)
Ok, thanks.

## 2020-02-08 NOTE — Telephone Encounter (Signed)
Spoke with pt. He states that he will call their office back and r/s on a later date. He is just not able to attend his appt on 02/11/20. Pt did not say why.

## 2020-02-08 NOTE — Telephone Encounter (Signed)
Sean Fitzgerald from [facility] called to let Dr Reginia Forts know that patient was supposed to have an appt with them on Monday (02/11/20) but patient called and cancelled the appt and did not reschedule.

## 2020-02-25 DIAGNOSIS — R7989 Other specified abnormal findings of blood chemistry: Secondary | ICD-10-CM | POA: Diagnosis not present

## 2020-06-02 DIAGNOSIS — R809 Proteinuria, unspecified: Secondary | ICD-10-CM | POA: Diagnosis not present

## 2021-07-23 DIAGNOSIS — Z1159 Encounter for screening for other viral diseases: Secondary | ICD-10-CM | POA: Diagnosis not present

## 2021-07-23 DIAGNOSIS — G8929 Other chronic pain: Secondary | ICD-10-CM | POA: Diagnosis not present

## 2021-07-23 DIAGNOSIS — M79622 Pain in left upper arm: Secondary | ICD-10-CM | POA: Diagnosis not present

## 2021-07-23 DIAGNOSIS — M25512 Pain in left shoulder: Secondary | ICD-10-CM | POA: Diagnosis not present

## 2021-07-23 DIAGNOSIS — M19012 Primary osteoarthritis, left shoulder: Secondary | ICD-10-CM | POA: Diagnosis not present

## 2021-07-23 DIAGNOSIS — R5383 Other fatigue: Secondary | ICD-10-CM | POA: Diagnosis not present

## 2021-07-23 DIAGNOSIS — M542 Cervicalgia: Secondary | ICD-10-CM | POA: Diagnosis not present

## 2021-07-23 DIAGNOSIS — Z13220 Encounter for screening for lipoid disorders: Secondary | ICD-10-CM | POA: Diagnosis not present

## 2021-07-23 DIAGNOSIS — Z136 Encounter for screening for cardiovascular disorders: Secondary | ICD-10-CM | POA: Diagnosis not present

## 2021-08-10 DIAGNOSIS — M4722 Other spondylosis with radiculopathy, cervical region: Secondary | ICD-10-CM | POA: Diagnosis not present

## 2021-08-10 DIAGNOSIS — M25512 Pain in left shoulder: Secondary | ICD-10-CM | POA: Diagnosis not present

## 2021-08-10 DIAGNOSIS — G8929 Other chronic pain: Secondary | ICD-10-CM | POA: Diagnosis not present

## 2021-08-10 DIAGNOSIS — S46212S Strain of muscle, fascia and tendon of other parts of biceps, left arm, sequela: Secondary | ICD-10-CM | POA: Diagnosis not present

## 2021-08-18 DIAGNOSIS — X58XXXS Exposure to other specified factors, sequela: Secondary | ICD-10-CM | POA: Diagnosis not present

## 2021-08-18 DIAGNOSIS — G8929 Other chronic pain: Secondary | ICD-10-CM | POA: Diagnosis not present

## 2021-08-18 DIAGNOSIS — R29898 Other symptoms and signs involving the musculoskeletal system: Secondary | ICD-10-CM | POA: Diagnosis not present

## 2021-08-18 DIAGNOSIS — S46212S Strain of muscle, fascia and tendon of other parts of biceps, left arm, sequela: Secondary | ICD-10-CM | POA: Diagnosis not present

## 2021-08-18 DIAGNOSIS — M4722 Other spondylosis with radiculopathy, cervical region: Secondary | ICD-10-CM | POA: Diagnosis not present

## 2021-08-18 DIAGNOSIS — M25512 Pain in left shoulder: Secondary | ICD-10-CM | POA: Diagnosis not present

## 2021-08-18 DIAGNOSIS — M2560 Stiffness of unspecified joint, not elsewhere classified: Secondary | ICD-10-CM | POA: Diagnosis not present

## 2021-08-18 DIAGNOSIS — Z789 Other specified health status: Secondary | ICD-10-CM | POA: Diagnosis not present

## 2021-08-28 DIAGNOSIS — R29898 Other symptoms and signs involving the musculoskeletal system: Secondary | ICD-10-CM | POA: Diagnosis not present

## 2021-08-28 DIAGNOSIS — M25512 Pain in left shoulder: Secondary | ICD-10-CM | POA: Diagnosis not present

## 2021-08-28 DIAGNOSIS — G8929 Other chronic pain: Secondary | ICD-10-CM | POA: Diagnosis not present

## 2021-08-28 DIAGNOSIS — Z789 Other specified health status: Secondary | ICD-10-CM | POA: Diagnosis not present

## 2021-08-28 DIAGNOSIS — X58XXXS Exposure to other specified factors, sequela: Secondary | ICD-10-CM | POA: Diagnosis not present

## 2021-08-28 DIAGNOSIS — S46212S Strain of muscle, fascia and tendon of other parts of biceps, left arm, sequela: Secondary | ICD-10-CM | POA: Diagnosis not present

## 2021-08-28 DIAGNOSIS — M2560 Stiffness of unspecified joint, not elsewhere classified: Secondary | ICD-10-CM | POA: Diagnosis not present

## 2021-08-28 DIAGNOSIS — M4722 Other spondylosis with radiculopathy, cervical region: Secondary | ICD-10-CM | POA: Diagnosis not present

## 2021-08-31 DIAGNOSIS — X58XXXS Exposure to other specified factors, sequela: Secondary | ICD-10-CM | POA: Diagnosis not present

## 2021-08-31 DIAGNOSIS — M2560 Stiffness of unspecified joint, not elsewhere classified: Secondary | ICD-10-CM | POA: Diagnosis not present

## 2021-08-31 DIAGNOSIS — M25512 Pain in left shoulder: Secondary | ICD-10-CM | POA: Diagnosis not present

## 2021-08-31 DIAGNOSIS — Z789 Other specified health status: Secondary | ICD-10-CM | POA: Diagnosis not present

## 2021-08-31 DIAGNOSIS — S46212S Strain of muscle, fascia and tendon of other parts of biceps, left arm, sequela: Secondary | ICD-10-CM | POA: Diagnosis not present

## 2021-08-31 DIAGNOSIS — M4722 Other spondylosis with radiculopathy, cervical region: Secondary | ICD-10-CM | POA: Diagnosis not present

## 2021-08-31 DIAGNOSIS — R29898 Other symptoms and signs involving the musculoskeletal system: Secondary | ICD-10-CM | POA: Diagnosis not present

## 2021-08-31 DIAGNOSIS — G8929 Other chronic pain: Secondary | ICD-10-CM | POA: Diagnosis not present

## 2021-09-21 DIAGNOSIS — M25512 Pain in left shoulder: Secondary | ICD-10-CM | POA: Diagnosis not present

## 2021-09-21 DIAGNOSIS — S46212S Strain of muscle, fascia and tendon of other parts of biceps, left arm, sequela: Secondary | ICD-10-CM | POA: Diagnosis not present

## 2021-09-21 DIAGNOSIS — G8929 Other chronic pain: Secondary | ICD-10-CM | POA: Diagnosis not present

## 2021-09-21 DIAGNOSIS — M4722 Other spondylosis with radiculopathy, cervical region: Secondary | ICD-10-CM | POA: Diagnosis not present
